# Patient Record
Sex: Female | Born: 1945 | Race: White | Hispanic: No | Marital: Married | Smoking: Former smoker
Health system: Southern US, Community
[De-identification: ages and names within clinical notes are randomized; demographics above are authoritative.]

## PROBLEM LIST (undated history)

## (undated) DIAGNOSIS — Z789 Other specified health status: Secondary | ICD-10-CM

## (undated) DIAGNOSIS — M1990 Unspecified osteoarthritis, unspecified site: Secondary | ICD-10-CM

## (undated) DIAGNOSIS — I4891 Unspecified atrial fibrillation: Secondary | ICD-10-CM

## (undated) HISTORY — PX: EYE SURGERY: SHX253

## (undated) HISTORY — DX: Unspecified osteoarthritis, unspecified site: M19.90

## (undated) HISTORY — PX: TONSILLECTOMY AND ADENOIDECTOMY: SUR1326

## (undated) HISTORY — PX: DILATION AND CURETTAGE OF UTERUS: SHX78

## (undated) HISTORY — PX: BREAST BIOPSY: SHX20

---

## 2006-06-08 ENCOUNTER — Ambulatory Visit: Payer: Self-pay

## 2013-08-17 ENCOUNTER — Ambulatory Visit: Payer: Self-pay | Admitting: Family Medicine

## 2014-08-07 LAB — TSH: TSH: 1.62 u[IU]/mL (ref 0.41–5.90)

## 2014-08-07 LAB — BASIC METABOLIC PANEL
BUN: 19 mg/dL (ref 4–21)
CREATININE: 0.8 mg/dL (ref 0.5–1.1)
Glucose: 93 mg/dL
Potassium: 4.4 mmol/L (ref 3.4–5.3)
SODIUM: 143 mmol/L (ref 137–147)

## 2014-08-07 LAB — HEPATIC FUNCTION PANEL
ALT: 18 U/L (ref 7–35)
AST: 22 U/L (ref 13–35)
Alkaline Phosphatase: 67 U/L (ref 25–125)
BILIRUBIN, TOTAL: 0.6 mg/dL

## 2014-08-07 LAB — LIPID PANEL
CHOLESTEROL: 257 mg/dL — AB (ref 0–200)
HDL: 87 mg/dL — AB (ref 35–70)
LDL Cholesterol: 152 mg/dL
Triglycerides: 91 mg/dL (ref 40–160)

## 2014-10-05 ENCOUNTER — Other Ambulatory Visit: Payer: Self-pay | Admitting: Otolaryngology

## 2014-10-05 DIAGNOSIS — J301 Allergic rhinitis due to pollen: Secondary | ICD-10-CM | POA: Diagnosis not present

## 2014-10-05 DIAGNOSIS — R221 Localized swelling, mass and lump, neck: Secondary | ICD-10-CM | POA: Diagnosis not present

## 2014-10-11 ENCOUNTER — Ambulatory Visit
Admission: RE | Admit: 2014-10-11 | Discharge: 2014-10-11 | Disposition: A | Discharge status: Home / Self Care | Payer: Medicare PPO | Source: Ambulatory Visit | Attending: Otolaryngology | Admitting: Otolaryngology

## 2014-10-11 DIAGNOSIS — R221 Localized swelling, mass and lump, neck: Secondary | ICD-10-CM | POA: Diagnosis not present

## 2014-10-11 MED ORDER — IOHEXOL 300 MG/ML  SOLN
75.0000 mL | Freq: Once | INTRAMUSCULAR | Status: AC | PRN
  Administered 2014-10-11: 75 mL via INTRAVENOUS

## 2015-03-27 DIAGNOSIS — H2513 Age-related nuclear cataract, bilateral: Secondary | ICD-10-CM | POA: Diagnosis not present

## 2015-06-13 DIAGNOSIS — M1990 Unspecified osteoarthritis, unspecified site: Secondary | ICD-10-CM | POA: Insufficient documentation

## 2015-06-13 DIAGNOSIS — E7800 Pure hypercholesterolemia, unspecified: Secondary | ICD-10-CM | POA: Insufficient documentation

## 2015-06-13 DIAGNOSIS — I8390 Asymptomatic varicose veins of unspecified lower extremity: Secondary | ICD-10-CM | POA: Insufficient documentation

## 2015-06-13 DIAGNOSIS — K589 Irritable bowel syndrome without diarrhea: Secondary | ICD-10-CM | POA: Insufficient documentation

## 2015-06-25 ENCOUNTER — Other Ambulatory Visit: Payer: Self-pay | Admitting: Family Medicine

## 2015-06-25 ENCOUNTER — Encounter: Payer: Self-pay | Admitting: Family Medicine

## 2015-06-25 ENCOUNTER — Ambulatory Visit (INDEPENDENT_AMBULATORY_CARE_PROVIDER_SITE_OTHER): Payer: Medicare Other | Admitting: Family Medicine

## 2015-06-25 VITALS — BP 128/74 | HR 84 | Temp 97.8°F | Resp 16 | Wt 193.0 lb

## 2015-06-25 DIAGNOSIS — K5900 Constipation, unspecified: Secondary | ICD-10-CM

## 2015-06-25 DIAGNOSIS — M79605 Pain in left leg: Secondary | ICD-10-CM | POA: Diagnosis not present

## 2015-06-25 DIAGNOSIS — M150 Primary generalized (osteo)arthritis: Principal | ICD-10-CM

## 2015-06-25 DIAGNOSIS — M159 Polyosteoarthritis, unspecified: Secondary | ICD-10-CM

## 2015-06-25 MED ORDER — MELOXICAM 7.5 MG PO TABS: 7.5000 mg | ORAL_TABLET | Freq: Two times a day (BID) | ORAL | Status: DC

## 2015-06-25 NOTE — Progress Notes (Signed)
         Patient: Melissa Barry Female    DOB: 11/05/1945   70 y.o.   MRN: [id number] Visit Date: 06/25/2015  Today's Provider: Lorie PhenixNancy Madisyn Mawhinney, MD   Chief Complaint  Patient presents with  . Leg Pain   Subjective:    Leg Pain  The incident occurred more than 1 week ago (Has been going on for about a month). There was no injury mechanism. The pain is present in the left leg. The quality of the pain is described as aching. The pain has been worsening (Especially since Sunday. Worse with stairs and  sitting too long. ) since onset. The symptoms are aggravated by weight bearing and movement. She has tried NSAIDs and acetaminophen for the symptoms. The treatment provided no relief (Patient concerned about gout.  ).       Allergies  Allergen Reactions  . Doxycycline Other (See Comments)    Made her feel sick the whole time she was taking it.   Marland Kitchen. Penicillins Rash  . Sulfa Antibiotics Swelling and Rash    Patient states throat swelling    Previous Medications   No medications on file    Review of Systems  Constitutional: Negative.   Musculoskeletal: Positive for arthralgias. Negative for myalgias, back pain, joint swelling, gait problem, neck pain and neck stiffness.  Neurological: Negative for dizziness, light-headedness and headaches.    Social History  Substance Use Topics  . Smoking status: Former Smoker -- 2 years    Types: Cigarettes  . Smokeless tobacco: Never Used  . Alcohol Use: No   Objective:   BP 128/74 mmHg  Pulse 84  Temp(Src) 97.8 F (36.6 C) (Oral)  Resp 16  Wt 193 lb (87.544 kg)  Physical Exam  Constitutional: She is oriented to person, place, and time. She appears well-developed and well-nourished.  Musculoskeletal: She exhibits no edema or tenderness.  Fullness behind left knee. Multiple varicose veins noted.    Neurological: She is alert and oriented to person, place, and time.  Skin: Skin is warm and dry.      Assessment & Plan:     1. Pain  of left lower extremity New problem Suspect Baker's cyst. Start anti-inflammatory and follow up with ortho as scheduled.    2. Primary osteoarthritis involving multiple joints Start medication.   - meloxicam (MOBIC) 7.5 MG tablet; Take 1 tablet (7.5 mg total) by mouth 2 (two) times daily.  Dispense: 60 tablet; Refill: 0  3. Constipation, unspecified constipation type Increase fluid intake and OTC Stool softeners.       Lorie PhenixNancy Shaily Librizzi, MD  [facility]

## 2015-08-07 ENCOUNTER — Encounter: Payer: Self-pay | Admitting: Family Medicine

## 2015-09-24 ENCOUNTER — Ambulatory Visit (INDEPENDENT_AMBULATORY_CARE_PROVIDER_SITE_OTHER): Payer: Medicare Other | Admitting: Family Medicine

## 2015-09-24 ENCOUNTER — Encounter: Payer: Self-pay | Admitting: Family Medicine

## 2015-09-24 VITALS — BP 126/86 | HR 72 | Temp 97.8°F | Resp 20 | Ht 64.75 in | Wt 193.0 lb

## 2015-09-24 DIAGNOSIS — Z1159 Encounter for screening for other viral diseases: Secondary | ICD-10-CM | POA: Diagnosis not present

## 2015-09-24 DIAGNOSIS — Z1211 Encounter for screening for malignant neoplasm of colon: Secondary | ICD-10-CM | POA: Diagnosis not present

## 2015-09-24 DIAGNOSIS — E7800 Pure hypercholesterolemia, unspecified: Secondary | ICD-10-CM

## 2015-09-24 DIAGNOSIS — Z0000 Encounter for general adult medical examination without abnormal findings: Secondary | ICD-10-CM | POA: Diagnosis not present

## 2015-09-24 DIAGNOSIS — Z780 Asymptomatic menopausal state: Secondary | ICD-10-CM | POA: Diagnosis not present

## 2015-09-24 DIAGNOSIS — Z23 Encounter for immunization: Secondary | ICD-10-CM | POA: Diagnosis not present

## 2015-09-24 NOTE — Progress Notes (Signed)
Patient ID: Melissa Barry, female   DOB: 1945/06/07, 70 y.o.   MRN: [id number]       Patient: Melissa Barry, Female    DOB: Apr 11, 1946, 70 y.o.   MRN: [id number] Visit Date: 09/24/2015  Today's Provider: Lorie Phenix, MD   Chief Complaint  Patient presents with  . Medicare Wellness   Subjective:    Annual wellness visit Melissa Barry is a 70 y.o. female. She feels well. She reports exercising none. She reports she is sleeping well.  08/06/14 AWE 09/14/13 Pap-neg, HPV-neg; GYN Dr. Luella Cook;  09/14/13 Mammogram-BI-RADS 1 GYN 08/17/13 BMD-osteopenia, recheck in 2 yrs -----------------------------------------------------------   Review of Systems  Constitutional: Positive for fatigue and unexpected weight change.  HENT: Negative.   Eyes: Negative.   Respiratory: Negative.   Cardiovascular: Negative.   Gastrointestinal: Negative.   Endocrine: Negative.   Genitourinary: Negative.   Musculoskeletal: Positive for joint swelling and arthralgias.  Skin: Negative.   Allergic/Immunologic: Negative.   Neurological: Negative.   Hematological: Negative.   Psychiatric/Behavioral: Negative.     Social History   Social History  . Marital Status: Married    Spouse Name: N/A  . Number of Children: 2  . Years of Education: N/A   Occupational History  . Not on file.   Social History Main Topics  . Smoking status: Former Smoker -- 2 years    Types: Cigarettes  . Smokeless tobacco: Never Used  . Alcohol Use: 1.8 oz/week    3 Cans of beer per week  . Drug Use: No  . Sexual Activity: Not on file   Other Topics Concern  . Not on file   Social History Narrative    History reviewed. No pertinent past medical history.   Patient Active Problem List   Diagnosis Date Noted  . Constipation 06/25/2015  . Hypercholesterolemia 06/13/2015  . 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 Arthritis, degenerative 06/13/2015  . 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 Tonsillectomy and adenoidectomy    . Breast biopsy    . Dilation and curettage of uterus      Her family history includes Celiac disease in her sister; Congestive Heart Failure in her father; Heart disease in her father; Hepatitis in her sister; Lung cancer in her father and paternal grandfather; Migraines in her daughter and son; Ovarian cancer in her mother; Parkinson's disease in her maternal grandfather; Sarcoidosis in her son; Stroke in her paternal grandmother; Thyroid disease in her father.    Previous Medications   No medications on file    Patient Care Team: Lorie Phenix, MD as PCP - General (Family Medicine)     Objective:   Vitals: BP 126/86 mmHg  Pulse 72  Temp(Src) 97.8 F (36.6 C) (Oral)  Resp 20  Ht 5' 4.75" (1.645 m)  Wt 193 lb (87.544 kg)  BMI 32.35 kg/m2  Physical Exam  Constitutional: She is oriented to person, place, and time. She appears well-developed and well-nourished.  HENT:  Head: Normocephalic and atraumatic.  Right Ear: Tympanic membrane, external ear and ear canal normal.  Left Ear: Tympanic membrane, external ear and ear canal normal.  Nose: Nose normal.  Mouth/Throat: Uvula is midline, oropharynx is clear and moist and mucous membranes are normal.  Eyes: Conjunctivae, EOM and lids are normal. Pupils are equal, round, and reactive to light.  Neck: Trachea normal and normal range of motion. Neck supple. Carotid bruit is not present. No thyroid mass and  no thyromegaly present.  Cardiovascular: Normal rate, regular rhythm and normal heart sounds.   Pulmonary/Chest: Effort normal and breath sounds normal.  Abdominal: Soft. Normal appearance and bowel sounds are normal. There is no hepatosplenomegaly. There is no tenderness.  Genitourinary: No breast swelling, tenderness or discharge.  Musculoskeletal: Normal range of motion.  Lymphadenopathy:    She has no cervical adenopathy.    She has no axillary adenopathy.  Neurological:  She is alert and oriented to person, place, and time. She has normal strength. No cranial nerve deficit.  Skin: Skin is warm, dry and intact.  Psychiatric: She has a normal mood and affect. Her speech is normal and behavior is normal. Judgment and thought content normal. Cognition and memory are normal.    Activities of Daily Living In your present state of health, do you have any difficulty performing the following activities: 09/24/2015  Hearing? N  Vision? N  Difficulty concentrating or making decisions? N  Walking or climbing stairs? Y  Dressing or bathing? N  Doing errands, shopping? N    Fall Risk Assessment Fall Risk  09/24/2015  Falls in the past year? No     Depression Screen PHQ 2/9 Scores 09/24/2015  PHQ - 2 Score 0    Cognitive Testing - 6-CIT  Correct? Score   What year is it? yes 0 0 or 4  What month is it? yes 0 0 or 3  Memorize:    Melissa Barry,  Melissa Barry,  Melissa Barry,  Melissa Barry,  Melissa Barry,      What time is it? (within 1 hour) yes 0 0 or 3  Count backwards from 20 yes 0 0, 2, or 4  Name the months of the year yes 0 0, 2, or 4  Repeat name & address above no 1 0, 2, 4, 6, 8, or 10       TOTAL SCORE  1/28   Interpretation:  Normal  Normal (0-7) Abnormal (8-28)     Assessment & Plan:     Annual Wellness Visit  Reviewed patient's Family Medical History Reviewed and updated list of patient's medical providers Assessment of cognitive impairment was done Assessed patient's functional ability Established a written schedule for health screening services Health Risk Assessent Completed and Reviewed  Exercise Activities and Dietary recommendations Goals    None      Immunization History  Administered Date(s) Administered  . Pneumococcal Conjugate-13 09/24/2015  . Pneumococcal Polysaccharide-23 02/03/2013     1. Medicare annual wellness visit, subsequent Stable. Patient advised to continue eating healthy and exercise daily.  2. Need for pneumococcal vaccination -  Pneumococcal conjugate vaccine 13-valent IM  3. Hypercholesterolemia - CBC with Differential/Platelet - Comprehensive metabolic panel - Lipid Panel With LDL/HDL Ratio - TSH  4. Postmenopausal - DG Bone Density; Future  5. Colon cancer screening - Ambulatory referral to Gastroenterology  6. Need for hepatitis C screening test - Hepatitis C antibody    Patient seen and examined by Dr. Leo GrosserNancy J.. Genelda Roark, and note scribed by Liz BeachSulibeya S. Dimas, CMA.  I have reviewed the document for accuracy and completeness and I agree with above. Leo Grosser- Shirl Weir J. Loray Akard, MD   Lorie PhenixNancy Oneal Schoenberger, MD   ------------------------------------------------------------------------------------------------------------

## 2015-09-26 ENCOUNTER — Telehealth: Payer: Self-pay

## 2015-09-26 LAB — CBC WITH DIFFERENTIAL/PLATELET
BASOS: 0 %
Basophils Absolute: 0 10*3/uL (ref 0.0–0.2)
EOS (ABSOLUTE): 0.1 10*3/uL (ref 0.0–0.4)
EOS: 1 %
HEMATOCRIT: 43.6 % (ref 34.0–46.6)
HEMOGLOBIN: 15.1 g/dL (ref 11.1–15.9)
IMMATURE GRANS (ABS): 0 10*3/uL (ref 0.0–0.1)
IMMATURE GRANULOCYTES: 0 %
LYMPHS: 6 %
Lymphocytes Absolute: 0.5 10*3/uL — ABNORMAL LOW (ref 0.7–3.1)
MCH: 30.6 pg (ref 26.6–33.0)
MCHC: 34.6 g/dL (ref 31.5–35.7)
MCV: 88 fL (ref 79–97)
MONOCYTES: 5 %
Monocytes Absolute: 0.5 10*3/uL (ref 0.1–0.9)
NEUTROS PCT: 88 %
Neutrophils Absolute: 8.2 10*3/uL — ABNORMAL HIGH (ref 1.4–7.0)
Platelets: 230 10*3/uL (ref 150–379)
RBC: 4.93 x10E6/uL (ref 3.77–5.28)
RDW: 13.2 % (ref 12.3–15.4)
WBC: 9.3 10*3/uL (ref 3.4–10.8)

## 2015-09-26 LAB — COMPREHENSIVE METABOLIC PANEL
ALK PHOS: 72 IU/L (ref 39–117)
ALT: 16 IU/L (ref 0–32)
AST: 20 IU/L (ref 0–40)
Albumin/Globulin Ratio: 2.1 (ref 1.2–2.2)
Albumin: 4.6 g/dL (ref 3.6–4.8)
BILIRUBIN TOTAL: 1 mg/dL (ref 0.0–1.2)
BUN/Creatinine Ratio: 19 (ref 12–28)
BUN: 16 mg/dL (ref 8–27)
CHLORIDE: 99 mmol/L (ref 96–106)
CO2: 25 mmol/L (ref 18–29)
Calcium: 9.5 mg/dL (ref 8.7–10.3)
Creatinine, Ser: 0.84 mg/dL (ref 0.57–1.00)
GFR calc Af Amer: 83 mL/min/{1.73_m2} (ref >59)
GFR calc non Af Amer: 72 mL/min/{1.73_m2} (ref >59)
Globulin, Total: 2.2 g/dL (ref 1.5–4.5)
Glucose: 110 mg/dL — ABNORMAL HIGH (ref 65–99)
Potassium: 4.6 mmol/L (ref 3.5–5.2)
Sodium: 139 mmol/L (ref 134–144)
Total Protein: 6.8 g/dL (ref 6.0–8.5)

## 2015-09-26 LAB — LIPID PANEL WITH LDL/HDL RATIO
CHOLESTEROL TOTAL: 256 mg/dL — AB (ref 100–199)
HDL: 83 mg/dL (ref >39)
LDL Calculated: 158 mg/dL — ABNORMAL HIGH (ref 0–99)
LDl/HDL Ratio: 1.9 ratio units (ref 0.0–3.2)
Triglycerides: 75 mg/dL (ref 0–149)
VLDL CHOLESTEROL CAL: 15 mg/dL (ref 5–40)

## 2015-09-26 LAB — HEPATITIS C ANTIBODY: Hep C Virus Ab: <0.1 s/co ratio (ref 0.0–0.9)

## 2015-09-26 LAB — TSH: TSH: 0.74 u[IU]/mL (ref 0.450–4.500)

## 2015-09-26 NOTE — Telephone Encounter (Signed)
-----   Message from Lorie PhenixNancy Maloney, MD sent at 09/26/2015  1:23 PM EDT ----- Labs stable except for mildly elevated blood sugar at 110, please add HgA1c, also cholesterol is elevated, but with high good cholesterol and 10 year risk of heart disease is 7.3 percent.  Right below cut off for recommending medication.   Would recommend eat healthy and exercise and recheck annually.  Thanks.

## 2015-09-26 NOTE — Telephone Encounter (Signed)
LMTCB 09/26/2015  Thanks,   -Vernona RiegerLaura

## 2015-09-27 NOTE — Telephone Encounter (Signed)
Pt advised; test added.   Thanks,   -Vernona RiegerLaura

## 2015-09-30 ENCOUNTER — Telehealth: Payer: Self-pay

## 2015-09-30 NOTE — Telephone Encounter (Signed)
-----   Message from Lorie PhenixNancy Maloney, MD sent at 09/29/2015  9:24 AM EDT ----- Blood sugar stable.  Please notify patient. Thanks.

## 2015-09-30 NOTE — Telephone Encounter (Signed)
Patient advised as below.  

## 2015-10-01 LAB — SPECIMEN STATUS REPORT

## 2015-10-01 LAB — HGB A1C W/O EAG: Hgb A1c MFr Bld: 5.1 % (ref 4.8–5.6)

## 2015-10-11 ENCOUNTER — Telehealth: Payer: Self-pay | Admitting: Gastroenterology

## 2015-10-11 NOTE — Telephone Encounter (Signed)
Patient is returning your call to schedule her colonoscopy.  

## 2015-10-18 ENCOUNTER — Other Ambulatory Visit: Payer: Self-pay

## 2015-10-18 NOTE — Telephone Encounter (Signed)
Gastroenterology Pre-Procedure Review  Request Date: 12/17/15 Requesting Physician: Dr. Elease HashimotoMaloney  PATIENT REVIEW QUESTIONS: The patient responded to the following health history questions as indicated:    1. Are you having any GI issues? no 2. Do you have a personal history of Polyps? no 3. Do you have a family history of Colon Cancer or Polyps? no 4. Diabetes Mellitus? no 5. Joint replacements in the past 12 months?no 6. Major health problems in the past 3 months?no 7. Any artificial heart valves, MVP, or defibrillator?no    MEDICATIONS & ALLERGIES:    Patient reports the following regarding taking any anticoagulation/antiplatelet therapy:   Plavix, Coumadin, Eliquis, Xarelto, Lovenox, Pradaxa, Brilinta, or Effient? no Aspirin? no  Patient confirms/reports the following medications:  No current outpatient prescriptions on file.   No current facility-administered medications for this visit.    Patient confirms/reports the following allergies:  Allergies  Allergen Reactions  . Doxycycline Other (See Comments)    Made her feel sick the whole time she was taking it.   Marland Kitchen. Penicillins Rash  . Sulfa Antibiotics Swelling and Rash    Patient states throat swelling     No orders of the defined types were placed in this encounter.    AUTHORIZATION INFORMATION Primary Insurance: 1D#: Group #:  Secondary Insurance: 1D#: Group #:  SCHEDULE INFORMATION: Date: 12/17/15 Time: Location: [facility]

## 2015-10-21 ENCOUNTER — Telehealth: Payer: Self-pay | Admitting: Gastroenterology

## 2015-10-21 NOTE — Telephone Encounter (Signed)
[facility]. Please precert.

## 2015-10-21 NOTE — Telephone Encounter (Signed)
Error

## 2015-10-23 ENCOUNTER — Ambulatory Visit
Admission: RE | Admit: 2015-10-23 | Discharge: 2015-10-23 | Disposition: A | Discharge status: Home / Self Care | Payer: Medicare Other | Source: Ambulatory Visit | Attending: Family Medicine | Admitting: Family Medicine

## 2015-10-23 DIAGNOSIS — Z780 Asymptomatic menopausal state: Secondary | ICD-10-CM

## 2015-10-23 DIAGNOSIS — Z13820 Encounter for screening for osteoporosis: Secondary | ICD-10-CM | POA: Diagnosis not present

## 2015-10-28 ENCOUNTER — Telehealth: Payer: Self-pay

## 2015-10-28 NOTE — Telephone Encounter (Signed)
LMTCB 10/28/2015  Thanks,   -Brittania Sudbeck  

## 2015-10-28 NOTE — Telephone Encounter (Signed)
-----   Message from Malva Limesonald E Fisher, MD sent at 10/25/2015  5:41 PM EDT ----- Bone density test is normal. Repeat every 3-5 years.

## 2015-11-01 NOTE — Telephone Encounter (Signed)
Pt advised.   Thanks,   -Saurabh Hettich  

## 2015-12-16 ENCOUNTER — Encounter: Payer: Self-pay | Admitting: *Deleted

## 2015-12-17 ENCOUNTER — Ambulatory Visit: Payer: Medicare Other | Admitting: Certified Registered"

## 2015-12-17 ENCOUNTER — Ambulatory Visit
Admission: RE | Admit: 2015-12-17 | Discharge: 2015-12-17 | Disposition: A | Discharge status: Home / Self Care | Payer: Medicare Other | Source: Ambulatory Visit | Attending: Gastroenterology | Admitting: Gastroenterology

## 2015-12-17 ENCOUNTER — Encounter: Payer: Self-pay | Admitting: *Deleted

## 2015-12-17 ENCOUNTER — Encounter
Admission: RE | Disposition: A | Discharge status: Home / Self Care | Payer: Self-pay | Source: Ambulatory Visit | Attending: Gastroenterology

## 2015-12-17 DIAGNOSIS — Z87891 Personal history of nicotine dependence: Secondary | ICD-10-CM | POA: Insufficient documentation

## 2015-12-17 DIAGNOSIS — K648 Other hemorrhoids: Secondary | ICD-10-CM | POA: Insufficient documentation

## 2015-12-17 DIAGNOSIS — K5730 Diverticulosis of large intestine without perforation or abscess without bleeding: Secondary | ICD-10-CM | POA: Diagnosis not present

## 2015-12-17 DIAGNOSIS — Z1211 Encounter for screening for malignant neoplasm of colon: Secondary | ICD-10-CM | POA: Diagnosis present

## 2015-12-17 DIAGNOSIS — I739 Peripheral vascular disease, unspecified: Secondary | ICD-10-CM | POA: Insufficient documentation

## 2015-12-17 HISTORY — DX: Other specified health status: Z78.9

## 2015-12-17 HISTORY — PX: COLONOSCOPY WITH PROPOFOL: SHX5780

## 2015-12-17 SURGERY — COLONOSCOPY WITH PROPOFOL
Anesthesia: General

## 2015-12-17 MED ORDER — PROPOFOL 500 MG/50ML IV EMUL
INTRAVENOUS | Status: DC | PRN
  Administered 2015-12-17: 125 ug/kg/min via INTRAVENOUS

## 2015-12-17 MED ORDER — PROPOFOL 10 MG/ML IV BOLUS
INTRAVENOUS | Status: DC | PRN
  Administered 2015-12-17: 60 mg via INTRAVENOUS

## 2015-12-17 MED ORDER — SODIUM CHLORIDE 0.9 % IV SOLN
INTRAVENOUS | Status: DC
  Administered 2015-12-17: 08:00:00 via INTRAVENOUS

## 2015-12-17 MED ORDER — LIDOCAINE HCL (CARDIAC) 20 MG/ML IV SOLN
INTRAVENOUS | Status: DC | PRN
  Administered 2015-12-17: 80 mg via INTRATRACHEAL

## 2015-12-17 NOTE — Transfer of Care (Signed)
Immediate Anesthesia Transfer of Care Note  Patient: Melissa SailorsJanie H [facility]  Procedure(s) Performed: Procedure(s): COLONOSCOPY WITH PROPOFOL (N/A)  Patient Location: PACU  Anesthesia Type:General  Level of Consciousness: sedated  Airway & Oxygen Therapy: Patient Spontanous Breathing and Patient connected to nasal cannula oxygen  Post-op Assessment: Report given to RN and Post -op Vital signs reviewed and stable  Post vital signs: Reviewed and stable  Last Vitals:  Vitals:   12/17/15 0747 12/17/15 0857  BP: (!) 150/59 (!) 117/51  Pulse: 65 67  Resp: 18 12  Temp: 36.3 C     Last Pain:  Vitals:   12/17/15 0747  TempSrc: Tympanic         Complications: No apparent anesthesia complications

## 2015-12-17 NOTE — H&P (Signed)
  Midge Miniumarren Rylinn Linzy, MD [facility] [address] [place], [place] [address] Phone: [phone] Fax : [phone]  Primary Care Physician:  Lorie PhenixNancy Maloney, MD Primary Gastroenterologist:  Dr. Servando SnareWohl  Pre-Procedure History & Physical: HPI:  Melissa HatcherJanie H Barry is a 70 y.o. female is here for a screening colonoscopy.   Past Medical History:  Diagnosis Date  . Medical history non-contributory     Past Surgical History:  Procedure Laterality Date  . BREAST BIOPSY    . DILATION AND CURETTAGE OF UTERUS    . TONSILLECTOMY AND ADENOIDECTOMY      Prior to Admission medications   Not on File    Allergies as of 10/18/2015 - Review Complete 09/24/2015  Allergen Reaction Noted  . Doxycycline Other (See Comments) 10/11/2014  . Penicillins Rash 10/11/2014  . Sulfa antibiotics Swelling and Rash 10/11/2014    Family History  Problem Relation Age of Onset  . Ovarian cancer Mother   . Heart disease Father   . Lung cancer Father   . Thyroid disease Father   . Congestive Heart Failure Father   . Hepatitis Sister   . Celiac disease Sister   . Migraines Daughter   . Migraines Son   . Sarcoidosis Son   . Parkinson's disease Maternal Grandfather   . Stroke Paternal Grandmother   . Lung cancer Paternal Grandfather     Social History   Social History  . Marital status: Married    Spouse name: N/A  . Number of children: 2  . Years of education: N/A   Occupational History  . Not on file.   Social History Main Topics  . Smoking status: Former Smoker    Years: 2.00    Types: Cigarettes  . Smokeless tobacco: Never Used  . Alcohol use 1.8 oz/week    3 Cans of beer per week  . Drug use: No  . Sexual activity: Not on file   Other Topics Concern  . Not on file   Social History Narrative  . No narrative on file    Review of Systems: See HPI, otherwise negative ROS  Physical Exam: BP (!) 150/59   Pulse 65   Temp 97.4 F (36.3 C) (Tympanic)   Resp 18   Ht 5\' 5"  (1.651 m)   Wt  193 lb (87.5 kg)   SpO2 100%   BMI 32.12 kg/m  General:   Alert,  pleasant and cooperative in NAD Head:  Normocephalic and atraumatic. Neck:  Supple; no masses or thyromegaly. Lungs:  Clear throughout to auscultation.    Heart:  Regular rate and rhythm. Abdomen:  Soft, nontender and nondistended. Normal bowel sounds, without guarding, and without rebound.   Neurologic:  Alert and  oriented x4;  grossly normal neurologically.  Impression/Plan: Melissa Barry is now here to undergo a screening colonoscopy.  Risks, benefits, and alternatives regarding colonoscopy have been reviewed with the patient.  Questions have been answered.  All parties agreeable.

## 2015-12-17 NOTE — Op Note (Signed)
[facility]enter[facility] Patient Name: Melissa AdjutantJanie Barry Procedure Date: 12/17/2015 8:38 AM MRN: [id number] Account #: [id number] Date of Birth: 06/29/1945 Admit Type: Outpatient Age: 70 [facility]OM 4 Gender: Female Note Status: Finalized Procedure:            Colonoscopy Indications:          Screening for colorectal malignant neoplasm Providers:            Midge Miniumarren Cristopher Ciccarelli MD, MD Referring MD:         Leo GrosserNancy J. Maloney, MD (Referring MD) Medicines:            Propofol per Anesthesia Complications:        No immediate complications. Procedure:            Pre-Anesthesia Assessment:                       - Prior to the procedure, a History and Physical was                        performed, and patient medications and allergies were                        reviewed. The patient's tolerance of previous                        anesthesia was also reviewed. The risks and benefits of                        the procedure and the sedation options and risks were                        discussed with the patient. All questions were                        answered, and informed consent was obtained. Prior                        Anticoagulants: The patient has taken no previous                        anticoagulant or antiplatelet agents. ASA Grade                        Assessment: II - A patient with mild systemic disease.                        After reviewing the risks and benefits, the patient was                        deemed in satisfactory condition to undergo the                        procedure.                       After obtaining informed consent, the colonoscope was                        passed under direct vision. Throughout the procedure,  the patient's blood pressure, pulse, and oxygen                        saturations were monitored continuously. The Olympus                        CF-Q160AL colonoscope (S#. [id number]) was introduced                      through the anus and advanced to the the cecum,                        identified by appendiceal orifice and ileocecal valve.                        The colonoscopy was performed without difficulty. The                        patient tolerated the procedure well. The quality of                        the bowel preparation was excellent. Findings:      The perianal and digital rectal examinations were normal.      A single small-mouthed diverticulum was found in the sigmoid colon.      Non-bleeding internal hemorrhoids were found during retroflexion. The       hemorrhoids were Grade I (internal hemorrhoids that do not prolapse). Impression:           - Diverticulosis in the sigmoid colon.                       - Non-bleeding internal hemorrhoids.                       - No specimens collected. Recommendation:       - Repeat colonoscopy in 10 years for screening unless                        any change in family history or lower GI problems. Procedure Code(s):    --- Professional ---                       [phone], Colonoscopy, flexible; diagnostic, including                        collection of specimen(s) by brushing or washing, when                        performed (separate procedure) Diagnosis Code(s):    --- Professional ---                       Z12.11, Encounter for screening for malignant neoplasm                        of colon CPT copyright 2016 American Medical Association. All rights reserved. The codes documented in this report are preliminary and upon coder review may  be revised to meet current compliance requirements. Midge Minium MD, MD 12/17/2015 8:55:16 AM This report has been signed electronically. Number of Addenda: 0 Note Initiated On: 12/17/2015 8:38 AM Scope Withdrawal Time: 0  hours 6 minutes 38 seconds  Total Procedure Duration: 0 hours 10 minutes 58 seconds       [facility]

## 2015-12-17 NOTE — Anesthesia Preprocedure Evaluation (Signed)
Anesthesia Evaluation  Patient identified by MRN, date of birth, ID band Patient awake    Reviewed: Allergy & Precautions, H&P , NPO status , Patient's Chart, lab work & pertinent test results, reviewed documented beta blocker date and time   Airway Mallampati: II   Neck ROM: full    Dental  (+) Poor Dentition   Pulmonary neg pulmonary ROS, former smoker,    Pulmonary exam normal        Cardiovascular + Peripheral Vascular Disease  negative cardio ROS Normal cardiovascular exam     Neuro/Psych negative neurological ROS  negative psych ROS   GI/Hepatic negative GI ROS, Neg liver ROS,   Endo/Other  negative endocrine ROS  Renal/GU negative Renal ROS  negative genitourinary   Musculoskeletal   Abdominal   Peds  Hematology negative hematology ROS (+)   Anesthesia Other Findings Past Medical History: No date: Medical history non-contributory Past Surgical History: No date: BREAST BIOPSY No date: DILATION AND CURETTAGE OF UTERUS No date: TONSILLECTOMY AND ADENOIDECTOMY BMI    Body Mass Index:  32.12 kg/m     Reproductive/Obstetrics                             Anesthesia Physical Anesthesia Plan  ASA: III  Anesthesia Plan: General   Post-op Pain Management:    Induction:   Airway Management Planned:   Additional Equipment:   Intra-op Plan:   Post-operative Plan:   Informed Consent: I have reviewed the patients History and Physical, chart, labs and discussed the procedure including the risks, benefits and alternatives for the proposed anesthesia with the patient or authorized representative who has indicated his/her understanding and acceptance.   Dental Advisory Given  Plan Discussed with: CRNA  Anesthesia Plan Comments:         Anesthesia Quick Evaluation

## 2015-12-17 NOTE — Anesthesia Postprocedure Evaluation (Signed)
Anesthesia Post Note  Patient: Melissa SailorsJanie H [facility]  Procedure(s) Performed: Procedure(s) (LRB): COLONOSCOPY WITH PROPOFOL (N/A)  Patient location during evaluation: PACU Anesthesia Type: General Level of consciousness: awake and alert Pain management: pain level controlled Vital Signs Assessment: post-procedure vital signs reviewed and stable Respiratory status: spontaneous breathing, nonlabored ventilation, respiratory function stable and patient connected to nasal cannula oxygen Cardiovascular status: blood pressure returned to baseline and stable Postop Assessment: no signs of nausea or vomiting Anesthetic complications: no    Last Vitals:  Vitals:   12/17/15 0857 12/17/15 0905  BP: (!) 117/51   Pulse: 69 63  Resp: 11 13  Temp: (!) 36.1 C     Last Pain:  Vitals:   12/17/15 0857  TempSrc: Tympanic                 Yevette EdwardsJames G Cali Cuartas

## 2015-12-18 ENCOUNTER — Encounter: Payer: Self-pay | Admitting: Gastroenterology

## 2017-02-23 ENCOUNTER — Ambulatory Visit (INDEPENDENT_AMBULATORY_CARE_PROVIDER_SITE_OTHER): Payer: Medicare Other

## 2017-02-23 ENCOUNTER — Ambulatory Visit (INDEPENDENT_AMBULATORY_CARE_PROVIDER_SITE_OTHER): Payer: Medicare Other | Admitting: Family Medicine

## 2017-02-23 VITALS — BP 148/80 | HR 64 | Temp 98.7°F | Ht 65.0 in | Wt 193.8 lb

## 2017-02-23 VITALS — BP 142/82 | HR 64 | Temp 98.7°F | Resp 16 | Ht 65.0 in | Wt 193.8 lb

## 2017-02-23 DIAGNOSIS — Z0000 Encounter for general adult medical examination without abnormal findings: Secondary | ICD-10-CM | POA: Diagnosis not present

## 2017-02-23 DIAGNOSIS — E7800 Pure hypercholesterolemia, unspecified: Secondary | ICD-10-CM | POA: Diagnosis not present

## 2017-02-23 DIAGNOSIS — R030 Elevated blood-pressure reading, without diagnosis of hypertension: Secondary | ICD-10-CM | POA: Diagnosis not present

## 2017-02-23 NOTE — Patient Instructions (Signed)

## 2017-02-23 NOTE — Progress Notes (Signed)
Subjective:   Melissa Barry is a 71 y.o. female who presents for Medicare Annual (Subsequent) preventive examination.  Review of Systems:  N/A  Cardiac Risk Factors include: advanced age (>2855men, 69>65 women);obesity (BMI >30kg/m2);dyslipidemia     Objective:     Vitals: BP (!) 148/80 (BP Location: Left Arm)   Pulse 64   Temp 98.7 F (37.1 C) (Oral)   Ht 5\' 5"  (1.651 m)   Wt 193 lb 12.8 oz (87.9 kg)   BMI 32.25 kg/m   Body mass index is 32.25 kg/m.   Tobacco Social History   Tobacco Use  Smoking Status Former Smoker  . Years: 2.00  . Types: Cigarettes  Smokeless Tobacco Never Used     Counseling given: Not Answered   Past Medical History:  Diagnosis Date  . Medical history non-contributory    Past Surgical History:  Procedure Laterality Date  . BREAST BIOPSY    . DILATION AND CURETTAGE OF UTERUS    . TONSILLECTOMY AND ADENOIDECTOMY     Family History  Problem Relation Age of Onset  . Ovarian cancer Mother   . Heart disease Father   . Lung cancer Father   . Thyroid disease Father   . Congestive Heart Failure Father   . Hepatitis Sister   . Celiac disease Sister   . Migraines Daughter   . Migraines Son   . Sarcoidosis Son   . Parkinson's disease Maternal Grandfather   . Stroke Paternal Grandmother   . Lung cancer Paternal Grandfather    Social History   Substance and Sexual Activity  Sexual Activity Not on file    Outpatient Encounter Medications as of 02/23/2017  Medication Sig  . ibuprofen (ADVIL,MOTRIN) 200 MG tablet Take 200 mg every 6 (six) hours as needed by mouth.   No facility-administered encounter medications on file as of 02/23/2017.     Activities of Daily Living In your present state of health, do you have any difficulty performing the following activities: 02/23/2017  Hearing? N  Vision? N  Difficulty concentrating or making decisions? N  Walking or climbing stairs? Y  Comment right knee pain and feels like its going to  "give out"  Dressing or bathing? N  Doing errands, shopping? N  Preparing Food and eating ? N  Using the Toilet? N  In the past six months, have you accidently leaked urine? N  Do you have problems with loss of bowel control? N  Managing your Medications? N  Managing your Finances? N  Housekeeping or managing your Housekeeping? N  Some recent data might be hidden    Patient Care Team: Erasmo DownerBacigalupo, Angela M, MD as PCP - General (Family Medicine) Juanell FairlyKrasinski, Kevin, MD as Referring Physician (Orthopedic Surgery) Galen ManilaPorfilio, William, MD as Referring Physician (Ophthalmology)    Assessment:     Exercise Activities and Dietary recommendations Current Exercise Habits: The patient does not participate in regular exercise at present, Exercise limited by: None identified  Goals    . Exercise 3x per week (30 min per time)     Recommend doing some form of exercise for 3 days a week for at least 30 minutes.       Fall Risk Fall Risk  02/23/2017 09/24/2015  Falls in the past year? No No   Depression Screen PHQ 2/9 Scores 02/23/2017 02/23/2017 09/24/2015  PHQ - 2 Score 0 0 0  PHQ- 9 Score 0 - -     Cognitive Function: Pt declined a screening today.  Immunization History  Administered Date(s) Administered  . Pneumococcal Conjugate-13 09/24/2015  . Pneumococcal Polysaccharide-23 02/03/2013  . Td 06/24/2010  . Tdap 06/24/2010   Screening Tests Health Maintenance  Topic Date Due  . MAMMOGRAM  09/15/2015  . INFLUENZA VACCINE  07/19/2017 (Originally 11/18/2016)  . TETANUS/TDAP  06/23/2020  . COLONOSCOPY  12/16/2025  . DEXA SCAN  Completed  . Hepatitis C Screening  Completed  . PNA vac Low Risk Adult  Completed      Plan:  I have personally reviewed and addressed the Medicare Annual Wellness questionnaire and have noted the following in the patient's chart:  A. Medical and social history B. Use of alcohol, tobacco or illicit drugs  C. Current medications and  supplements D. Functional ability and status E.  Nutritional status F.  Physical activity G. Advance directives H. List of other physicians I.  Hospitalizations, surgeries, and ER visits in previous 12 months J.  Vitals K. Screenings such as hearing and vision if needed, cognitive and depression L. Referrals and appointments - none  In addition, I have reviewed and discussed with patient certain preventive protocols, quality metrics, and best practice recommendations. A written personalized care plan for preventive services as well as general preventive health recommendations were provided to patient.  See attached scanned questionnaire for additional information.   Signed,  Hyacinth MeekerMckenzie Darious Rehman, LPN Nurse Health Advisor   MD Recommendations: Pt declined the influenza vaccine today. Pt to set up her mammogram by the end of 2018.

## 2017-02-23 NOTE — Assessment & Plan Note (Addendum)
Has not made any lifestyle changes at this time long discussion about healthy diet/exercise Recheck lipid panel Advised patient to consider baby ASA daily Reconsider statin pending ASCVD risk

## 2017-02-23 NOTE — Patient Instructions (Signed)
[name] , Thank you for taking time to come for your Medicare Wellness Visit. I appreciate your ongoing commitment to your health goals. Please review the following plan we discussed and let me know if I can assist you in the future.   Screening recommendations/referrals: Colonoscopy: Up to date Mammogram: Pt to set up by the end of 2018 Bone Density: Up to date Recommended yearly ophthalmology/optometry visit for glaucoma screening and checkup Recommended yearly dental visit for hygiene and checkup  Vaccinations: Influenza vaccine: declined Pneumococcal vaccine: completed series Tdap vaccine: up to date Shingles vaccine: declined  Advanced directives: Advance directive discussed with you today. Even though you declined this today please call our office should you change your mind and we can give you the proper paperwork for you to fill out.  Conditions/risks identified: Obesity- recommend doing some form of exercise for 3 days a week for at least 30 minutes.   Next appointment: 11:00 AM today   Preventive Care 65 Years and Older, Female Preventive care refers to lifestyle choices and visits with your health care provider that can promote health and wellness. What does preventive care include?  A yearly physical exam. This is also called an annual well check.  Dental exams once or twice a year.  Routine eye exams. Ask your health care provider how often you should have your eyes checked.  Personal lifestyle choices, including:  Daily care of your teeth and gums.  Regular physical activity.  Eating a healthy diet.  Avoiding tobacco and drug use.  Limiting alcohol use.  Practicing safe sex.  Taking low-dose aspirin every day.  Taking vitamin and mineral supplements as recommended by your health care provider. What happens during an annual well check? The services and screenings done by your health care provider during your annual well check will depend on your  age, overall health, lifestyle risk factors, and family history of disease. Counseling  Your health care provider may ask you questions about your:  Alcohol use.  Tobacco use.  Drug use.  Emotional well-being.  Home and relationship well-being.  Sexual activity.  Eating habits.  History of falls.  Memory and ability to understand (cognition).  Work and work Astronomerenvironment.  Reproductive health. Screening  You may have the following tests or measurements:  Height, weight, and BMI.  Blood pressure.  Lipid and cholesterol levels. These may be checked every 5 years, or more frequently if you are over 69 years old.  Skin check.  Lung cancer screening. You may have this screening every year starting at age 69 if you have a 30-pack-year history of smoking and currently smoke or have quit within the past 15 years.  Fecal occult blood test (FOBT) of the stool. You may have this test every year starting at age 69.  Flexible sigmoidoscopy or colonoscopy. You may have a sigmoidoscopy every 5 years or a colonoscopy every 10 years starting at age 69.  Hepatitis C blood test.  Hepatitis B blood test.  Sexually transmitted disease (STD) testing.  Diabetes screening. This is done by checking your blood sugar (glucose) after you have not eaten for a while (fasting). You may have this done every 1-3 years.  Bone density scan. This is done to screen for osteoporosis. You may have this done starting at age 69.  Mammogram. This may be done every 1-2 years. Talk to your health care provider about how often you should have regular mammograms. Talk with your health care provider about your test results,  treatment options, and if necessary, the need for more tests. Vaccines  Your health care provider may recommend certain vaccines, such as:  Influenza vaccine. This is recommended every year.  Tetanus, diphtheria, and acellular pertussis (Tdap, Td) vaccine. You may need a Td booster  every 10 years.  Zoster vaccine. You may need this after age 38.  Pneumococcal 13-valent conjugate (PCV13) vaccine. One dose is recommended after age 11.  Pneumococcal polysaccharide (PPSV23) vaccine. One dose is recommended after age 61. Talk to your health care provider about which screenings and vaccines you need and how often you need them. This information is not intended to replace advice given to you by your health care provider. Make sure you discuss any questions you have with your health care provider. Document Released: 05/03/2015 Document Revised: 12/25/2015 Document Reviewed: 02/05/2015 Elsevier Interactive Patient Education  2017 [place] Prevention in the Home Falls can cause injuries. They can happen to people of all ages. There are many things you can do to make your home safe and to help prevent falls. What can I do on the outside of my home?  Regularly fix the edges of walkways and driveways and fix any cracks.  Remove anything that might make you trip as you walk through a door, such as a raised step or threshold.  Trim any bushes or trees on the path to your home.  Use bright outdoor lighting.  Clear any walking paths of anything that might make someone trip, such as rocks or tools.  Regularly check to see if handrails are loose or broken. Make sure that both sides of any steps have handrails.  Any raised decks and porches should have guardrails on the edges.  Have any leaves, snow, or ice cleared regularly.  Use sand or salt on walking paths during winter.  Clean up any spills in your garage right away. This includes oil or grease spills. What can I do in the bathroom?  Use night lights.  Install grab bars by the toilet and in the tub and shower. Do not use towel bars as grab bars.  Use non-skid mats or decals in the tub or shower.  If you need to sit down in the shower, use a plastic, non-slip stool.  Keep the floor dry. Clean up any  water that spills on the floor as soon as it happens.  Remove soap buildup in the tub or shower regularly.  Attach bath mats securely with double-sided non-slip rug tape.  Do not have throw rugs and other things on the floor that can make you trip. What can I do in the bedroom?  Use night lights.  Make sure that you have a light by your bed that is easy to reach.  Do not use any sheets or blankets that are too big for your bed. They should not hang down onto the floor.  Have a firm chair that has side arms. You can use this for support while you get dressed.  Do not have throw rugs and other things on the floor that can make you trip. What can I do in the kitchen?  Clean up any spills right away.  Avoid walking on wet floors.  Keep items that you use a lot in easy-to-reach places.  If you need to reach something above you, use a strong step stool that has a grab bar.  Keep electrical cords out of the way.  Do not use floor polish or wax that makes floors  slippery. If you must use wax, use non-skid floor wax.  Do not have throw rugs and other things on the floor that can make you trip. What can I do with my stairs?  Do not leave any items on the stairs.  Make sure that there are handrails on both sides of the stairs and use them. Fix handrails that are broken or loose. Make sure that handrails are as long as the stairways.  Check any carpeting to make sure that it is firmly attached to the stairs. Fix any carpet that is loose or worn.  Avoid having throw rugs at the top or bottom of the stairs. If you do have throw rugs, attach them to the floor with carpet tape.  Make sure that you have a light switch at the top of the stairs and the bottom of the stairs. If you do not have them, ask someone to add them for you. What else can I do to help prevent falls?  Wear shoes that:  Do not have high heels.  Have rubber bottoms.  Are comfortable and fit you well.  Are closed  at the toe. Do not wear sandals.  If you use a stepladder:  Make sure that it is fully opened. Do not climb a closed stepladder.  Make sure that both sides of the stepladder are locked into place.  Ask someone to hold it for you, if possible.  Clearly mark and make sure that you can see:  Any grab bars or handrails.  First and last steps.  Where the edge of each step is.  Use tools that help you move around (mobility aids) if they are needed. These include:  Canes.  Walkers.  Scooters.  Crutches.  Turn on the lights when you go into a dark area. Replace any light bulbs as soon as they burn out.  Set up your furniture so you have a clear path. Avoid moving your furniture around.  If any of your floors are uneven, fix them.  If there are any pets around you, be aware of where they are.  Review your medicines with your doctor. Some medicines can make you feel dizzy. This can increase your chance of falling. Ask your doctor what other things that you can do to help prevent falls. This information is not intended to replace advice given to you by your health care provider. Make sure you discuss any questions you have with your health care provider. Document Released: 01/31/2009 Document Revised: 09/12/2015 Document Reviewed: 05/11/2014 Elsevier Interactive Patient Education  2017 [employer].

## 2017-02-23 NOTE — Progress Notes (Signed)
Patient: Melissa Barry, Female    DOB: 1945/06/27, 71 y.o.   MRN: [id number] Visit Date: 02/23/2017  Today's Provider: Shirlee Latch, MD   Chief Complaint  Patient presents with  . Hyperlipidemia   Subjective:    Annual physical exam Melissa Barry is a 71 y.o. female who presents today for health maintenance and complete physical. She feels well. She reports exercising none. She reports she is sleeping well.  -----------------------------------------------------------------  Lipid/Cholesterol, Follow-up:   Last seen for this more than 1 year ago.  Management changes since that visit include checking labs; 10 year risk of heart dx/CVA was 7.3%. She was advised to eat healthy and exercise. She states she eats healthy, "but eats too much". Pt also states she does not exercise.  She has not made changes to diet/exercise.  She does not want to take a medication for her cholesterol. . Last Lipid Panel:    Component Value Date/Time   CHOL 256 (H) 09/25/2015 0826   TRIG 75 09/25/2015 0826   HDL 83 09/25/2015 0826   LDLCALC 158 (H) 09/25/2015 0826    Risk factors for vascular disease include hypercholesterolemia  Weight trend: stable Current diet: well balanced Current exercise: none  Wt Readings from Last 3 Encounters:  02/23/17 193 lb 12.8 oz (87.9 kg)  02/23/17 193 lb 12.8 oz (87.9 kg)  12/17/15 193 lb (87.5 kg)    -------------------------------------------------------------------   Review of Systems  Constitutional: Negative.   HENT: Negative.   Eyes: Negative.   Respiratory: Negative.   Cardiovascular: Negative.   Gastrointestinal: Negative.   Endocrine: Negative.   Genitourinary: Negative.   Musculoskeletal: Negative.   Skin: Negative.   Allergic/Immunologic: Negative.   Neurological: Negative.   Hematological: Negative.   Psychiatric/Behavioral: Negative.     Social History      She  reports that she has quit smoking. Her smoking use  included cigarettes. She quit after 2.00 years of use. she has never used smokeless tobacco. She reports that she drinks about 0.6 - 1.2 oz of alcohol per week. She reports that she does not use drugs.       Social History   Socioeconomic History  . Marital status: Married    Spouse name: Not on file  . Number of children: 2  . Years of education: Not on file  . Highest education level: Not on file  Social Needs  . Financial resource strain: Not on file  . Food insecurity - worry: Not on file  . Food insecurity - inability: Not on file  . Transportation needs - medical: Not on file  . Transportation needs - non-medical: Not on file  Occupational History  . Not on file  Tobacco Use  . Smoking status: Former Smoker    Years: 2.00    Types: Cigarettes  . Smokeless tobacco: Never Used  Substance and Sexual Activity  . Alcohol use: Yes    Alcohol/week: 0.6 - 1.2 oz    Types: 1 - 2 Cans of beer per week  . Drug use: No  . Sexual activity: Not on file  Other Topics Concern  . Not on file  Social History Narrative  . Not on file    Past Medical History:  Diagnosis Date  . Medical history non-contributory      Patient Active Problem List   Diagnosis Date Noted  . Special screening for malignant neoplasms, colon   . Constipation 06/25/2015  . Hypercholesterolemia 06/13/2015  .  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 Arthritis, degenerative 06/13/2015  . 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 BREAST BIOPSY    . DILATION AND CURETTAGE OF UTERUS    . TONSILLECTOMY AND ADENOIDECTOMY      Family History        Family Status  Relation Name Status  . Mother  Deceased at age 69  . Father  Deceased at age 69  . Sister  Alive  . Daughter  Alive  . Son  Alive  . MGF  Deceased  . PGM  Deceased  . PGF  Deceased        Her family history includes Celiac disease in her sister; Congestive Heart Failure in her father; Heart disease in her  father; Hepatitis in her sister; Lung cancer in her father and paternal grandfather; Migraines in her daughter and son; Ovarian cancer in her mother; Parkinson's disease in her maternal grandfather; Sarcoidosis in her son; Stroke in her paternal grandmother; Thyroid disease in her father.     Allergies  Allergen Reactions  . Doxycycline Other (See Comments)    Made her feel sick the whole time she was taking it.   Marland Kitchen. Penicillins Rash  . Sulfa Antibiotics Swelling and Rash    Patient states throat swelling     No current outpatient medications on file.   Patient Care Team: Erasmo DownerBacigalupo, Makinzee Durley M, MD as PCP - General (Family Medicine) Juanell FairlyKrasinski, Kevin, MD as Referring Physician (Orthopedic Surgery) Galen ManilaPorfilio, William, MD as Referring Physician (Ophthalmology)      Objective:   Vitals: BP (!) 142/82 (BP Location: Left Arm, Patient Position: Sitting, Cuff Size: Large)   Pulse 64   Temp 98.7 F (37.1 C) (Oral)   Resp 16   Ht 5\' 5"  (1.651 m)   Wt 193 lb 12.8 oz (87.9 kg)   BMI 32.25 kg/m    Vitals:   02/23/17 1052  BP: (!) 142/82  Pulse: 64  Resp: 16  Temp: 98.7 F (37.1 C)  TempSrc: Oral  Weight: 193 lb 12.8 oz (87.9 kg)  Height: 5\' 5"  (1.651 m)     Physical Exam  Constitutional: She is oriented to person, place, and time. She appears well-developed and well-nourished. No distress.  HENT:  Head: Normocephalic and atraumatic.  Right Ear: External ear normal.  Left Ear: External ear normal.  Nose: Nose normal.  Mouth/Throat: Oropharynx is clear and moist. No oropharyngeal exudate.  Eyes: Conjunctivae are normal. Pupils are equal, round, and reactive to light. No scleral icterus.  Neck: Neck supple. No thyromegaly present.  Cardiovascular: Normal rate, regular rhythm, normal heart sounds and intact distal pulses.  No murmur heard. Pulmonary/Chest: Effort normal and breath sounds normal. No respiratory distress. She has no wheezes. She has no rales.  Abdominal: Soft. Bowel  sounds are normal. She exhibits no distension. There is no tenderness. There is no rebound and no guarding.  Musculoskeletal: She exhibits no edema or deformity.  Lymphadenopathy:    She has no cervical adenopathy.  Neurological: She is alert and oriented to person, place, and time.  Skin: Skin is warm and dry. No rash noted.  Psychiatric: She has a normal mood and affect. Her behavior is normal.  Vitals reviewed.    Depression Screen PHQ 2/9 Scores 02/23/2017 02/23/2017 09/24/2015  PHQ - 2 Score 0 0 0  PHQ- 9 Score 0 - -     Assessment & Plan:     Routine Health Maintenance  and Physical Exam  Exercise Activities and Dietary recommendations Goals    . Exercise 3x per week (30 min per time)     Recommend doing some form of exercise for 3 days a week for at least 30 minutes.        Immunization History  Administered Date(s) Administered  . Pneumococcal Conjugate-13 09/24/2015  . Pneumococcal Polysaccharide-23 02/03/2013  . Td 06/24/2010  . Tdap 06/24/2010    Health Maintenance  Topic Date Due  . MAMMOGRAM  09/15/2015  . INFLUENZA VACCINE  07/19/2017 (Originally 11/18/2016)  . TETANUS/TDAP  06/23/2020  . COLONOSCOPY  12/16/2025  . DEXA SCAN  Completed  . Hepatitis C Screening  Completed  . PNA vac Low Risk Adult  Completed     Discussed health benefits of physical activity, and encouraged her to engage in regular exercise appropriate for her age and condition.    Problem List Items Addressed This Visit      Other   Hypercholesterolemia    Has not made any lifestyle changes at this time long discussion about healthy diet/exercise Recheck lipid panel Advised patient to consider baby ASA daily Reconsider statin pending ASCVD risk      Relevant Orders   Comprehensive metabolic panel   Lipid panel   Elevated BP without diagnosis of hypertension    Elevated today Likely white coat as patient states this is normally well controlled Recheck at next visit Check  CMP       Other Visit Diagnoses    Healthcare maintenance    -  Primary   Relevant Orders   CBC   Comprehensive metabolic panel   Lipid panel      -------------------------------------------------------------------- Return in about 1 year (around 02/23/2018) for AWV/CPE.  The entirety of the information documented in the History of Present Illness, Review of Systems and Physical Exam were personally obtained by me. Portions of this information were initially documented by Irving BurtonEmily Ratchford, CMA and reviewed by me for thoroughness and accuracy.    Shirlee LatchAngela Liev Brockbank, MD  [facility]

## 2017-02-23 NOTE — Progress Notes (Deleted)
       Patient: Melissa HatcherJanie H Barry Female    DOB: 05/21/1945   71 y.o.   MRN: [id number] Visit Date: 02/23/2017  Today's Provider: Shirlee LatchAngela Bacigalupo, MD   Chief Complaint  Patient presents with  . Hyperlipidemia   Subjective:    HPI      Lipid/Cholesterol, Follow-up:   Last seen for this more than 1 year ago.  Management changes since that visit include checking labs; 10 year risk of heart dx/CVA was 7.3%. She was advised to eat healthy and exercise. She states she eats healthy, "but eats too much". Pt also states she does not exercise. . Last Lipid Panel:    Component Value Date/Time   CHOL 256 (H) 09/25/2015 0826   TRIG 75 09/25/2015 0826   HDL 83 09/25/2015 0826   LDLCALC 158 (H) 09/25/2015 0826    Risk factors for vascular disease include hypercholesterolemia  Weight trend: stable Current diet: well balanced Current exercise: none  Wt Readings from Last 3 Encounters:  02/23/17 193 lb 12.8 oz (87.9 kg)  02/23/17 193 lb 12.8 oz (87.9 kg)  12/17/15 193 lb (87.5 kg)    -------------------------------------------------------------------   Allergies  Allergen Reactions  . Doxycycline Other (See Comments)    Made her feel sick the whole time she was taking it.   Marland Kitchen. Penicillins Rash  . Sulfa Antibiotics Swelling and Rash    Patient states throat swelling      Current Outpatient Medications:  .  ibuprofen (ADVIL,MOTRIN) 200 MG tablet, Take 200 mg every 6 (six) hours as needed by mouth., Disp: , Rfl:   Review of Systems  Constitutional: Negative.   HENT: Negative.   Eyes: Negative.   Respiratory: Negative.   Cardiovascular: Negative.   Gastrointestinal: Negative.   Endocrine: Negative.   Genitourinary: Negative.   Musculoskeletal: Negative.   Skin: Negative.   Allergic/Immunologic: Negative.   Neurological: Negative.   Hematological: Negative.   Psychiatric/Behavioral: Negative.     Social History   Tobacco Use  . Smoking status: Former Smoker   Years: 2.00    Types: Cigarettes  . Smokeless tobacco: Never Used  Substance Use Topics  . Alcohol use: Yes    Alcohol/week: 0.6 - 1.2 oz    Types: 1 - 2 Cans of beer per week   Objective:   BP (!) 142/82 (BP Location: Left Arm, Patient Position: Sitting, Cuff Size: Large)   Pulse 64   Temp 98.7 F (37.1 C) (Oral)   Resp 16   Ht 5\' 5"  (1.651 m)   Wt 193 lb 12.8 oz (87.9 kg)   BMI 32.25 kg/m  Vitals:   02/23/17 1052  BP: (!) 142/82  Pulse: 64  Resp: 16  Temp: 98.7 F (37.1 C)  TempSrc: Oral  Weight: 193 lb 12.8 oz (87.9 kg)  Height: 5\' 5"  (1.651 m)     Physical Exam      Assessment & Plan:           Shirlee LatchAngela Bacigalupo, MD  [facility]

## 2017-02-24 ENCOUNTER — Other Ambulatory Visit: Payer: Self-pay | Admitting: Family Medicine

## 2017-02-24 DIAGNOSIS — R030 Elevated blood-pressure reading, without diagnosis of hypertension: Secondary | ICD-10-CM | POA: Insufficient documentation

## 2017-02-24 DIAGNOSIS — Z1231 Encounter for screening mammogram for malignant neoplasm of breast: Secondary | ICD-10-CM

## 2017-02-24 LAB — LIPID PANEL
CHOL/HDL RATIO: 3.2 (calc) (ref <5.0)
Cholesterol: 261 mg/dL — ABNORMAL HIGH (ref <200)
HDL: 82 mg/dL (ref >50)
LDL CHOLESTEROL (CALC): 156 mg/dL — AB
NON-HDL CHOLESTEROL (CALC): 179 mg/dL — AB (ref <130)
TRIGLYCERIDES: 117 mg/dL (ref <150)

## 2017-02-24 LAB — CBC
HEMATOCRIT: 43.6 % (ref 35.0–45.0)
Hemoglobin: 14.8 g/dL (ref 11.7–15.5)
MCH: 30 pg (ref 27.0–33.0)
MCHC: 33.9 g/dL (ref 32.0–36.0)
MCV: 88.4 fL (ref 80.0–100.0)
MPV: 11 fL (ref 7.5–12.5)
Platelets: 229 10*3/uL (ref 140–400)
RBC: 4.93 10*6/uL (ref 3.80–5.10)
RDW: 12.3 % (ref 11.0–15.0)
WBC: 5.2 10*3/uL (ref 3.8–10.8)

## 2017-02-24 LAB — COMPLETE METABOLIC PANEL WITH GFR
AG Ratio: 2 (calc) (ref 1.0–2.5)
ALKALINE PHOSPHATASE (APISO): 67 U/L (ref 33–130)
ALT: 15 U/L (ref 6–29)
AST: 19 U/L (ref 10–35)
Albumin: 4.4 g/dL (ref 3.6–5.1)
BILIRUBIN TOTAL: 0.8 mg/dL (ref 0.2–1.2)
BUN: 19 mg/dL (ref 7–25)
CHLORIDE: 105 mmol/L (ref 98–110)
CO2: 28 mmol/L (ref 20–32)
Calcium: 9.5 mg/dL (ref 8.6–10.4)
Creat: 0.76 mg/dL (ref 0.50–0.99)
GFR, Est African American: 93 mL/min/{1.73_m2} (ref >=60)
GFR, Est Non African American: 80 mL/min/{1.73_m2} (ref >=60)
GLUCOSE: 90 mg/dL (ref 65–99)
Globulin: 2.2 g/dL (calc) (ref 1.9–3.7)
Potassium: 4.4 mmol/L (ref 3.5–5.3)
Sodium: 141 mmol/L (ref 135–146)
Total Protein: 6.6 g/dL (ref 6.1–8.1)

## 2017-02-24 NOTE — Assessment & Plan Note (Signed)
Elevated today Likely white coat as patient states this is normally well controlled Recheck at next visit Check CMP

## 2017-02-25 ENCOUNTER — Ambulatory Visit
Admission: RE | Admit: 2017-02-25 | Discharge: 2017-02-25 | Disposition: A | Discharge status: Home / Self Care | Payer: Medicare Other | Source: Ambulatory Visit | Attending: Family Medicine | Admitting: Family Medicine

## 2017-02-25 DIAGNOSIS — Z1231 Encounter for screening mammogram for malignant neoplasm of breast: Secondary | ICD-10-CM | POA: Diagnosis present

## 2017-03-09 ENCOUNTER — Other Ambulatory Visit: Payer: Self-pay | Admitting: *Deleted

## 2017-03-09 ENCOUNTER — Inpatient Hospital Stay
Admission: RE | Admit: 2017-03-09 | Discharge: 2017-03-09 | Disposition: A | Discharge status: Home / Self Care | Payer: Self-pay | Source: Ambulatory Visit | Attending: *Deleted | Admitting: *Deleted

## 2017-03-09 DIAGNOSIS — Z9289 Personal history of other medical treatment: Secondary | ICD-10-CM

## 2017-03-10 ENCOUNTER — Telehealth: Payer: Self-pay

## 2017-03-10 NOTE — Telephone Encounter (Signed)
-----   Message from Erasmo DownerAngela M Bacigalupo, MD sent at 03/10/2017  9:37 AM EST ----- Normal mammogram  Bacigalupo, Marzella SchleinAngela M, MD, MPH [facility] 03/10/2017 9:37 AM

## 2017-03-10 NOTE — Telephone Encounter (Signed)
Left message advising pt. OK per DPR. 

## 2017-05-24 ENCOUNTER — Encounter: Payer: Self-pay | Admitting: Podiatry

## 2017-05-24 ENCOUNTER — Ambulatory Visit: Payer: Medicare Other | Admitting: Podiatry

## 2017-05-24 DIAGNOSIS — L600 Ingrowing nail: Secondary | ICD-10-CM

## 2017-05-24 NOTE — Progress Notes (Signed)
   Subjective:    Patient ID: Melissa Barry, female    DOB: 02/18/1946, 72 y.o.   MRN: [id number]  HPI: She presents today with a chief complaint of what she thinks is an ingrown toenail to the fibular border hallux right.  She states that is been like this on and off for the past year.  She states that the nail began to look like this when she had to clip the nail Deep one time and pull out the spicule of nail.  She states that since that time the nail curves down into her skin becomes sore.  She uses Neosporin for relief and it seems to have relieved it.  She is also concerned about a possible nail fungus due to white patches on her toenails.  She states that she has just removed long-term dark finger nail polish.    Review of Systems  Musculoskeletal: Positive for arthralgias.  All other systems reviewed and are negative.      Objective:   Physical Exam: Vital signs are stable alert and oriented x3.  Pulses are strongly palpable.  Neurologic sensorium is intact per Semmes Weinstein monofilament.  Deep tendon reflexes are intact bilateral.  Muscle strength +5/5 dorsiflexors plantar flexors inverters everters all intrinsic musculature is intact.  Orthopedic evaluation demonstrates all joints distal to the ankle full range of motion without crepitation.  Cutaneous evaluation demonstrates supple well-hydrated cutis with exception of sharp incurvated nail margin along the fibular border of the hallux right.  There is a nail dystrophy associated with this resulting in the ingrown nail portion.  There are white patches on the nail plates which appear to be nail binder.  I was able to demonstrate this by using a bur on the top of the nail.  This does not look like a superficial white onychomycosis rather just a nail plate that has been painted for too long.        Assessment & Plan:  Mild paronychia with ingrown nail hallux right.  Plan: Discussed etiology pathology conservative versus surgical  therapies.  At this point I trimmed the margin out to alleviate her symptoms for her.  She is about to go [place] a trip to [facility] and does not want to have any type of surgical procedure performed today.  I will follow-up with her in the near future for a chemical matrixectomy to the fibular border of the hallux right.

## 2017-06-30 ENCOUNTER — Ambulatory Visit: Payer: Medicare Other | Admitting: Podiatry

## 2017-09-20 ENCOUNTER — Emergency Department: Payer: Medicare Other

## 2017-09-20 ENCOUNTER — Emergency Department
Admission: EM | Admit: 2017-09-20 | Discharge: 2017-09-21 | Disposition: A | Discharge status: Home / Self Care | Payer: Medicare Other | Attending: Emergency Medicine | Admitting: Emergency Medicine

## 2017-09-20 ENCOUNTER — Other Ambulatory Visit: Payer: Self-pay

## 2017-09-20 ENCOUNTER — Encounter: Payer: Self-pay | Admitting: Emergency Medicine

## 2017-09-20 DIAGNOSIS — Z87891 Personal history of nicotine dependence: Secondary | ICD-10-CM | POA: Diagnosis not present

## 2017-09-20 DIAGNOSIS — R002 Palpitations: Secondary | ICD-10-CM | POA: Diagnosis present

## 2017-09-20 LAB — BASIC METABOLIC PANEL
Anion gap: 11 (ref 5–15)
BUN: 22 mg/dL — AB (ref 6–20)
CALCIUM: 9.3 mg/dL (ref 8.9–10.3)
CO2: 22 mmol/L (ref 22–32)
Chloride: 110 mmol/L (ref 101–111)
Creatinine, Ser: 0.79 mg/dL (ref 0.44–1.00)
GFR calc Af Amer: >60 mL/min (ref >60)
GLUCOSE: 137 mg/dL — AB (ref 65–99)
POTASSIUM: 3.5 mmol/L (ref 3.5–5.1)
Sodium: 143 mmol/L (ref 135–145)

## 2017-09-20 LAB — CBC
HEMATOCRIT: 43.6 % (ref 35.0–47.0)
HEMOGLOBIN: 15 g/dL (ref 12.0–16.0)
MCH: 31.1 pg (ref 26.0–34.0)
MCHC: 34.4 g/dL (ref 32.0–36.0)
MCV: 90.5 fL (ref 80.0–100.0)
Platelets: 204 10*3/uL (ref 150–440)
RBC: 4.82 MIL/uL (ref 3.80–5.20)
RDW: 13.3 % (ref 11.5–14.5)
WBC: 6 10*3/uL (ref 3.6–11.0)

## 2017-09-20 LAB — TROPONIN I: Troponin I: <0.03 ng/mL (ref <0.03)

## 2017-09-20 NOTE — ED Triage Notes (Signed)
Patient ambulatory to triage with steady gait, without difficulty or distress noted; pt reports "I feel like my heart is not doing what it's supposed to be doing"; denies pain or other accomp symptoms

## 2017-09-21 NOTE — ED Provider Notes (Signed)
[facility] Emergency Department Provider Note    First MD Initiated Contact with Patient 09/20/17 2345     (approximate)  I have reviewed the triage vital signs and the nursing notes.   HISTORY  Chief Complaint Palpitations    HPI Melissa Barry is a 72 y.o. female below list of chronic medical conditions presents to the emergency department with episodic heart palpitations which she describes as a regular lasting approximately 20 minutes each episode.  Patient states that she had a episode 2 weeks ago followed by spontaneous resolution.  Patient states that episode occurred after drinking a wine cooler.  Patient states the episode tonight occurred after drinking a half of a beer again similar symptom lasting approximately 20 minutes.  Patient denies any chest pain no shortness of breath   Past Medical History:  Diagnosis Date  . Medical history non-contributory     Patient Active Problem List   Diagnosis Date Noted  . Elevated BP without diagnosis of hypertension 02/24/2017  . Special screening for malignant neoplasms, colon   . Constipation 06/25/2015  . Hypercholesterolemia 06/13/2015  . 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 Arthritis, degenerative 06/13/2015  . 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 BREAST BIOPSY Left years ago   Dr. Lemar LivingsByrnett   . COLONOSCOPY WITH PROPOFOL N/A 12/17/2015   Procedure: COLONOSCOPY WITH PROPOFOL;  Surgeon: Midge Miniumarren Wohl, MD;  Location: [facility] ENDOSCOPY;  Service: Endoscopy;  Laterality: N/A;  . DILATION AND CURETTAGE OF UTERUS    . TONSILLECTOMY AND ADENOIDECTOMY      Prior to Admission medications   Not on File    Allergies Doxycycline; Penicillins; and Sulfa antibiotics  Family History  Problem Relation Age of Onset  . Ovarian cancer Mother   . Heart disease Father   . Lung cancer Father   . Thyroid disease Father   . Congestive Heart Failure Father   . Hepatitis  Sister   . Celiac disease Sister   . Migraines Daughter   . Migraines Son   . Sarcoidosis Son   . Parkinson's disease Maternal Grandfather   . Stroke Paternal Grandmother   . Lung cancer Paternal Grandfather     Social History Social History   Tobacco Use  . Smoking status: Former Smoker    Years: 2.00    Types: Cigarettes  . Smokeless tobacco: Never Used  Substance Use Topics  . Alcohol use: Yes    Alcohol/week: 0.6 - 1.2 oz    Types: 1 - 2 Cans of beer per week  . Drug use: No    Review of Systems Constitutional: No fever/chills Eyes: No visual changes. ENT: No sore throat. Cardiovascular: Denies chest pain. Respiratory: Denies shortness of breath. Gastrointestinal: No abdominal pain.  No nausea, no vomiting.  No diarrhea.  No constipation. Genitourinary: Negative for dysuria. Musculoskeletal: Negative for neck pain.  Negative for back pain. Integumentary: Negative for rash. Neurological: Negative for headaches, focal weakness or numbness.   ____________________________________________   PHYSICAL EXAM:  VITAL SIGNS: ED Triage Vitals [09/20/17 2104]  Enc Vitals Group     BP (!) 180/86     Pulse Rate 89     Resp 18     Temp 98.4 F (36.9 C)     Temp Source Oral     SpO2 95 %     Weight 86.6 kg (191 lb)     Height 1.676 m (5\' 6" )     Head  Circumference      Peak Flow      Pain Score 0     Pain Loc      Pain Edu?      Excl. in GC?     Constitutional: Alert and oriented. Well appearing and in no acute distress. Eyes: Conjunctivae are normal. Head: Atraumatic. Mouth/Throat: Mucous membranes are moist. Oropharynx non-erythematous. Neck: No stridor.   Cardiovascular: Normal rate, regular rhythm. Good peripheral circulation. Grossly normal heart sounds. Respiratory: Normal respiratory effort.  No retractions. Lungs CTAB. Gastrointestinal: Soft and nontender. No distention.  Musculoskeletal: No lower extremity tenderness nor edema. No gross deformities  of extremities. Neurologic:  Normal speech and language. No gross focal neurologic deficits are appreciated.  Skin:  Skin is warm, dry and intact. No rash noted. Psychiatric: Mood and affect are normal. Speech and behavior are normal.  ____________________________________________   LABS (all labs ordered are listed, but only abnormal results are displayed)  Labs Reviewed  BASIC METABOLIC PANEL - Abnormal; Notable for the following components:      Result Value   Glucose, Bld 137 (*)    BUN 22 (*)    All other components within normal limits  CBC  TROPONIN I   ____________________________________________  EKG  ED ECG REPORT I, [place] N BROWN, the attending physician, personally viewed and interpreted this ECG.   Date: 09/21/2017  EKG Time: 10:06 PM  Rate: 87  Rhythm: Normal sinus rhythm  Axis: Normal  Intervals: Normal  ST&T Change: None  ____________________________________________  RADIOLOGY I, [place] N BROWN, personally viewed and evaluated these images (plain radiographs) as part of my medical decision making, as well as reviewing the written report by the radiologist.  ED MD interpretation: Acute cardiopulmonary disease noted per radiologist on chest x-ray  Official radiology report(s): Dg Chest 2 View  Result Date: 09/20/2017 CLINICAL DATA:  Initial evaluation for acute chest pain. EXAM: CHEST - 2 VIEW COMPARISON:  None available. FINDINGS: The cardiac and mediastinal silhouettes are within normal limits. The lungs are normally inflated. No airspace consolidation, pleural effusion, or pulmonary edema is identified. There is no pneumothorax. No acute osseous abnormality identified. IMPRESSION: No active cardiopulmonary disease. Electronically Signed   By: Rise Mu M.D.   On: 09/20/2017 21:41      Procedures   ____________________________________________   INITIAL IMPRESSION / ASSESSMENT AND PLAN / ED COURSE  As part of my medical decision  making, I reviewed the following data within the electronic MEDICAL RECORD NUMBER   72 year old female presented with above-stated history and physical exam secondary to heart palpitations.  Patient's EKG revealed no evidence of arrhythmia likewise patient was observed on cardiac monitor in the emergency department with only normal sinus rhythm noted.  Patient's laboratory data unremarkable.  Spoke with the patient and her husband at length regarding the necessity of following up with cardiology for further outpatient evaluation and management.  Patient's husband requested Dr. Lady Gary ____________________________________________  FINAL CLINICAL IMPRESSION(S) / ED DIAGNOSES  Final diagnoses:  Heart palpitations     MEDICATIONS GIVEN DURING THIS VISIT:  Medications - No data to display   ED Discharge Orders    None       Note:  This document was prepared using Dragon voice recognition software and may include unintentional dictation errors.    Darci Current, MD 09/21/17 [phone]

## 2017-10-15 DIAGNOSIS — R0602 Shortness of breath: Secondary | ICD-10-CM | POA: Insufficient documentation

## 2017-10-15 DIAGNOSIS — I479 Paroxysmal tachycardia, unspecified: Secondary | ICD-10-CM | POA: Insufficient documentation

## 2017-10-15 DIAGNOSIS — I480 Paroxysmal atrial fibrillation: Secondary | ICD-10-CM | POA: Insufficient documentation

## 2018-02-24 ENCOUNTER — Ambulatory Visit (INDEPENDENT_AMBULATORY_CARE_PROVIDER_SITE_OTHER): Payer: Medicare Other

## 2018-02-24 ENCOUNTER — Ambulatory Visit (INDEPENDENT_AMBULATORY_CARE_PROVIDER_SITE_OTHER): Payer: Medicare Other | Admitting: Family Medicine

## 2018-02-24 ENCOUNTER — Encounter: Payer: Self-pay | Admitting: Family Medicine

## 2018-02-24 VITALS — BP 148/72 | HR 61 | Temp 98.8°F | Ht 65.0 in | Wt 192.0 lb

## 2018-02-24 VITALS — BP 141/80 | HR 61 | Temp 98.8°F | Ht 65.0 in | Wt 192.0 lb

## 2018-02-24 DIAGNOSIS — R030 Elevated blood-pressure reading, without diagnosis of hypertension: Secondary | ICD-10-CM | POA: Diagnosis not present

## 2018-02-24 DIAGNOSIS — M7661 Achilles tendinitis, right leg: Secondary | ICD-10-CM | POA: Diagnosis not present

## 2018-02-24 DIAGNOSIS — G6289 Other specified polyneuropathies: Secondary | ICD-10-CM

## 2018-02-24 DIAGNOSIS — Z1239 Encounter for other screening for malignant neoplasm of breast: Secondary | ICD-10-CM

## 2018-02-24 DIAGNOSIS — G629 Polyneuropathy, unspecified: Secondary | ICD-10-CM | POA: Insufficient documentation

## 2018-02-24 DIAGNOSIS — Z0000 Encounter for general adult medical examination without abnormal findings: Secondary | ICD-10-CM

## 2018-02-24 DIAGNOSIS — M7662 Achilles tendinitis, left leg: Secondary | ICD-10-CM

## 2018-02-24 DIAGNOSIS — E7800 Pure hypercholesterolemia, unspecified: Secondary | ICD-10-CM

## 2018-02-24 NOTE — Assessment & Plan Note (Signed)
Discussed lifestyle interventions Recheck FLP and CMP Reconsider statin pending ASCVD risk

## 2018-02-24 NOTE — Assessment & Plan Note (Signed)
Minimal with normal exam Discussed gabapentin, but patient prefers not to take a medication Will check B12 level Return precautions discussed

## 2018-02-24 NOTE — Progress Notes (Signed)
Subjective:   Melissa Barry is a 72 y.o. female who presents for Medicare Annual (Subsequent) preventive examination.  Review of Systems:  N/A  Cardiac Risk Factors include: advanced age (>36men, >62 women);obesity (BMI >30kg/m2)     Objective:     Vitals: BP (!) 148/72 (BP Location: Right Arm)   Pulse 61   Temp 98.8 F (37.1 C) (Oral)   Ht 5\' 5"  (1.651 m)   Wt 192 lb (87.1 kg)   BMI 31.95 kg/m   Body mass index is 31.95 kg/m.  Advanced Directives 02/24/2018 02/23/2017 12/17/2015 09/24/2015  Does Patient Have a Medical Advance Directive? No No No Yes  Type of Advance Directive - - Web designer;Living will  Would patient like information on creating a medical advance directive? No - Patient declined No - Patient declined - -    Tobacco Social History   Tobacco Use  Smoking Status Former Smoker  . Years: 2.00  . Types: Cigarettes  Smokeless Tobacco Never Used  Tobacco Comment   only smoked in college     Counseling given: Not Answered Comment: only smoked in college   Clinical Intake:  Pre-visit preparation completed: Yes  Pain : No/denies pain Pain Score: 0-No pain     Nutritional Status: BMI > 30  Obese Nutritional Risks: None Diabetes: No  How often do you need to have someone help you when you read instructions, pamphlets, or other written materials from your doctor or pharmacy?: 1 - Never  Interpreter Needed?: No  Information entered by :: [facility], LPN  Past Medical History:  Diagnosis Date  . Medical history non-contributory    Past Surgical History:  Procedure Laterality Date  . BREAST BIOPSY Left years ago   Dr. Lemar Livings   . COLONOSCOPY WITH PROPOFOL N/A 12/17/2015   Procedure: COLONOSCOPY WITH PROPOFOL;  Surgeon: Midge Minium, MD;  Location: [facility] ENDOSCOPY;  Service: Endoscopy;  Laterality: N/A;  . DILATION AND CURETTAGE OF UTERUS    . TONSILLECTOMY AND ADENOIDECTOMY     Family History  Problem Relation Age of Onset    . Ovarian cancer Mother   . Heart disease Father   . Lung cancer Father   . Thyroid disease Father   . Congestive Heart Failure Father   . Hepatitis Sister   . Celiac disease Sister   . Migraines Daughter   . Migraines Son   . Sarcoidosis Son   . Parkinson's disease Maternal Grandfather   . Stroke Paternal Grandmother   . Lung cancer Paternal Grandfather    Social History   Socioeconomic History  . Marital status: Married    Spouse name: Not on file  . Number of children: 2  . Years of education: Not on file  . Highest education level: Some college, no degree  Occupational History  . Not on file  Social Needs  . Financial resource strain: Not hard at all  . Food insecurity:    Worry: Never true    Inability: Never true  . Transportation needs:    Medical: No    Non-medical: No  Tobacco Use  . Smoking status: Former Smoker    Years: 2.00    Types: Cigarettes  . Smokeless tobacco: Never Used  . Tobacco comment: only smoked in college  Substance and Sexual Activity  . Alcohol use: Yes    Alcohol/week: 1.0 - 2.0 standard drinks    Types: 1 - 2 Cans of beer per week  . Drug use: No  .  Sexual activity: Not on file  Lifestyle  . Physical activity:    Days per week: Not on file    Minutes per session: Not on file  . Stress: Not at all  Relationships  . Social connections:    Talks on phone: Patient refused    Gets together: Patient refused    Attends religious service: Patient refused    Active member of club or organization: Patient refused    Attends meetings of clubs or organizations: Patient refused    Relationship status: Patient refused  Other Topics Concern  . Not on file  Social History Narrative  . Not on file    Outpatient Encounter Medications as of 02/24/2018  Medication Sig  . naproxen (NAPROSYN) 500 MG tablet Take 500 mg by mouth as needed.    No facility-administered encounter medications on file as of 02/24/2018.     Activities of Daily  Living In your present state of health, do you have any difficulty performing the following activities: 02/24/2018  Hearing? N  Vision? N  Comment Wears eyeglasses.   Difficulty concentrating or making decisions? N  Walking or climbing stairs? Y  Comment Due to leg pains.   Dressing or bathing? N  Doing errands, shopping? N  Preparing Food and eating ? N  Using the Toilet? N  In the past six months, have you accidently leaked urine? N  Do you have problems with loss of bowel control? N  Managing your Medications? N  Managing your Finances? N  Housekeeping or managing your Housekeeping? N  Some recent data might be hidden    Patient Care Team: Erasmo Downer, MD as PCP - General (Family Medicine) Juanell Fairly, MD as Referring Physician (Orthopedic Surgery) Galen Manila, MD as Referring Physician (Ophthalmology)    Assessment:   This is a routine wellness examination for Melissa Barry.  Exercise Activities and Dietary recommendations Current Exercise Habits: The patient does not participate in regular exercise at present, Exercise limited by: None identified  Goals    . Exercise 3x per week (30 min per time)     Recommend doing some form of exercise for 3 days a week for at least 30 minutes.   02/24/18, Continue trying to walk or exercise for 3 days a week for at least 30 mins a day.        Fall Risk Fall Risk  02/24/2018 02/23/2017 09/24/2015  Falls in the past year? 0 No No   FALL RISK PREVENTION PERTAINING TO THE HOME:  Any stairs in or around the home WITH handrails? Yes  Home free of loose throw rugs in walkways, pet beds, electrical cords, etc? Yes  Adequate lighting in your home to reduce risk of falls? Yes   ASSISTIVE DEVICES UTILIZED TO PREVENT FALLS:  Life alert? No  Use of a cane, walker or w/c? No  Grab bars in the bathroom? No  Shower chair or bench in shower? No  Elevated toilet seat or a handicapped toilet? No    TIMED UP AND GO:  Was the  test performed? No .    Depression Screen PHQ 2/9 Scores 02/24/2018 02/23/2017 02/23/2017 09/24/2015  PHQ - 2 Score 0 0 0 0  PHQ- 9 Score - 0 - -     Cognitive Function: Declined today.        Immunization History  Administered Date(s) Administered  . Pneumococcal Conjugate-13 09/24/2015  . Pneumococcal Polysaccharide-23 02/03/2013  . Td 06/24/2010  . Tdap 06/24/2010  Qualifies for Shingles Vaccine? Yes . Due for Shingrix. Education has been provided regarding the importance of this vaccine. Pt has been advised to call insurance company to determine out of pocket expense. Advised may also receive vaccine at local pharmacy or Health Dept. Verbalized acceptance and understanding.  Tdap: Up to date  Flu Vaccine: Due for Flu vaccine. Does the patient want to receive this vaccine today?  No . Education has been provided regarding the importance of this vaccine but still declined. Advised may receive this vaccine at local pharmacy or Health Dept. Aware to provide a copy of the vaccination record if obtained from local pharmacy or Health Dept. Verbalized acceptance and understanding.  Pneumococcal Vaccine: Up to date   Screening Tests Health Maintenance  Topic Date Due  . INFLUENZA VACCINE  07/19/2018 (Originally 11/18/2017)  . MAMMOGRAM  02/26/2019  . TETANUS/TDAP  06/23/2020  . COLONOSCOPY  12/16/2025  . DEXA SCAN  Completed  . Hepatitis C Screening  Completed  . PNA vac Low Risk Adult  Completed    Cancer Screenings:  Colorectal Screening: Completed 12/16/69. Repeat every 10 years.  Mammogram: Completed 03/09/17.  Bone Density: Completed 10/23/15. Results reflect NORMAL.   Lung Cancer Screening: (Low Dose CT Chest recommended if Age 66-80 years, 30 pack-year currently smoking OR have quit w/in 15years.) does not qualify.   Additional Screening:  Hepatitis C Screening: Up to date  Vision Screening: Recommended annual ophthalmology exams for early detection of glaucoma  and other disorders of the eye.  Dental Screening: Recommended annual dental exams for proper oral hygiene  Community Resource Referral:  CRR required this visit?  No       Plan:  I have personally reviewed and addressed the Medicare Annual Wellness questionnaire and have noted the following in the patient's chart:  A. Medical and social history B. Use of alcohol, tobacco or illicit drugs  C. Current medications and supplements D. Functional ability and status E.  Nutritional status F.  Physical activity G. Advance directives H. List of other physicians I.  Hospitalizations, surgeries, and ER visits in previous 12 months J.  Vitals K. Screenings such as hearing and vision if needed, cognitive and depression L. Referrals and appointments - none  In addition, I have reviewed and discussed with patient certain preventive protocols, quality metrics, and best practice recommendations. A written personalized care plan for preventive services as well as general preventive health recommendations were provided to patient.  See attached scanned questionnaire for additional information.   Signed,  Hyacinth Meeker, LPN Nurse Health Advisor   Nurse Recommendations: Pt declined the influenza vaccine today.

## 2018-02-24 NOTE — Progress Notes (Signed)
Patient: Melissa Barry, Female    DOB: 1945/12/31, 72 y.o.   MRN: [id number] Visit Date: 02/24/2018  Today's Provider: Shirlee Latch, MD   Chief Complaint  Patient presents with  . Annual Exam   Subjective:  I, Melissa Barry, CMA, am acting as a scribe for Shirlee Latch, MD.   Patient had a AWE with Melissa Barry prior to appointment   Complete Physical Melissa Barry is a 72 y.o. female. She feels fairly well. She C/O leg pain- burning sensation and toe numbness at night. This is worse after long periods of sitting.  Tingling of toes occurs only at night.  No RLS symptoms.  She reports exercising none. She reports she is sleeping poorly due to nocturia and urgency to urinate. These symptoms started years ago per patient.   -----------------------------------------------------------   Review of Systems  Constitutional: Negative.   HENT: Negative.   Eyes: Negative.   Respiratory: Negative.   Cardiovascular: Positive for palpitations.  Gastrointestinal: Negative.   Endocrine: Negative.   Genitourinary: Negative.   Musculoskeletal: Negative.   Skin: Negative.   Allergic/Immunologic: Negative.   Neurological: Negative.   Hematological: Negative.   Psychiatric/Behavioral: Negative.     Social History   Socioeconomic History  . Marital status: Married    Spouse name: Not on file  . Number of children: 2  . Years of education: Not on file  . Highest education level: Some college, no degree  Occupational History  . Not on file  Social Needs  . Financial resource strain: Not hard at all  . Food insecurity:    Worry: Never true    Inability: Never true  . Transportation needs:    Medical: No    Non-medical: No  Tobacco Use  . Smoking status: Former Smoker    Years: 2.00    Types: Cigarettes  . Smokeless tobacco: Never Used  . Tobacco comment: only smoked in college  Substance and Sexual Activity  . Alcohol use: Yes    Alcohol/week: 1.0 - 2.0 standard  drinks    Types: 1 - 2 Cans of beer per week  . Drug use: No  . Sexual activity: Not on file  Lifestyle  . Physical activity:    Days per week: Not on file    Minutes per session: Not on file  . Stress: Not at all  Relationships  . Social connections:    Talks on phone: Patient refused    Gets together: Patient refused    Attends religious service: Patient refused    Active member of club or organization: Patient refused    Attends meetings of clubs or organizations: Patient refused    Relationship status: Patient refused  . Intimate partner violence:    Fear of current or ex partner: Patient refused    Emotionally abused: Patient refused    Physically abused: Patient refused    Forced sexual activity: Patient refused  Other Topics Concern  . Not on file  Social History Narrative  . Not on file    Past Medical History:  Diagnosis Date  . Medical history non-contributory      Patient Active Problem List   Diagnosis Date Noted  . Tachycardia, paroxysmal (HCC) 10/15/2017  . SOB (shortness of breath) 10/15/2017  . Elevated BP without diagnosis of hypertension 02/24/2017  . Special screening for malignant neoplasms, colon   . Constipation 06/25/2015  . Hypercholesterolemia 06/13/2015  . 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 Arthritis, degenerative 06/13/2015  .  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 BREAST BIOPSY Left years ago   Dr. Lemar Livings   . COLONOSCOPY WITH PROPOFOL N/A 12/17/2015   Procedure: COLONOSCOPY WITH PROPOFOL;  Surgeon: Midge Minium, MD;  Location: [facility] ENDOSCOPY;  Service: Endoscopy;  Laterality: N/A;  . DILATION AND CURETTAGE OF UTERUS    . TONSILLECTOMY AND ADENOIDECTOMY      Her family history includes Celiac disease in her sister; Congestive Heart Failure in her father; Heart disease in her father; Hepatitis in her sister; Lung cancer in her father and paternal grandfather; Migraines in her daughter and son; Ovarian  cancer in her mother; Parkinson's disease in her maternal grandfather; Sarcoidosis in her son; Stroke in her paternal grandmother; Thyroid disease in her father.      Current Outpatient Medications:  .  naproxen (NAPROSYN) 500 MG tablet, Take 500 mg by mouth as needed. , Disp: , Rfl:   Patient Care Team: Erasmo Downer, MD as PCP - General (Family Medicine) Juanell Fairly, MD as Referring Physician (Orthopedic Surgery) Galen Manila, MD as Referring Physician (Ophthalmology)     Objective:   Vitals: BP (!) 148/72 (BP Location: Right Arm, Patient Position: Sitting, Cuff Size: Normal)   Pulse 61   Temp 98.8 F (37.1 C) (Oral)   Ht 5\' 5"  (1.651 m)   Wt 192 lb (87.1 kg)   BMI 31.95 kg/m   Physical Exam  Constitutional: She is oriented to person, place, and time. She appears well-developed and well-nourished. No distress.  HENT:  Head: Normocephalic and atraumatic.  Right Ear: External ear normal.  Left Ear: External ear normal.  Nose: Nose normal.  Mouth/Throat: Oropharynx is clear and moist.  Eyes: Pupils are equal, round, and reactive to light. Conjunctivae and EOM are normal. No scleral icterus.  Neck: Neck supple. No thyromegaly present.  Cardiovascular: Normal rate, regular rhythm, normal heart sounds and intact distal pulses.  No murmur heard. Pulmonary/Chest: Effort normal and breath sounds normal. No respiratory distress. She has no wheezes. She has no rales.  Abdominal: Soft. Bowel sounds are normal. She exhibits no distension. There is no tenderness. There is no rebound and no guarding.  Musculoskeletal: She exhibits no edema or deformity.  B/l feet/ankles: No visible erythema or swelling. Strength is 5/5 in all directions. Stable lateral and medial ligaments Talar dome nontender; No pain at base of 5th MT; No tenderness over cuboid; No tenderness over N spot or navicular prominence No tenderness on posterior aspects of lateral and medial malleolus No  sign of peroneal tendon subluxations; Negative tarsal tunnel tinel's Able to walk 4 steps. TTP over insertion of achilles tendon b/l and limited dorsiflexion.  Sensation intact grossly to light touch  Lymphadenopathy:    She has no cervical adenopathy.  Neurological: She is alert and oriented to person, place, and time.  Skin: Skin is warm and dry. Capillary refill takes less than 2 seconds. No rash noted.  Psychiatric: She has a normal mood and affect. Her behavior is normal.  Vitals reviewed.   Activities of Daily Living In your present state of health, do you have any difficulty performing the following activities: 02/24/2018  Hearing? N  Vision? N  Comment Wears eyeglasses.   Difficulty concentrating or making decisions? N  Walking or climbing stairs? Y  Comment Due to leg pains.   Dressing or bathing? N  Doing errands, shopping? N  Preparing Food and eating ? N  Using the Toilet? N  In the past six months, have you accidently leaked urine? N  Do you have problems with loss of bowel control? N  Managing your Medications? N  Managing your Finances? N  Housekeeping or managing your Housekeeping? N  Some recent data might be hidden    Fall Risk Assessment Fall Risk  02/24/2018 02/23/2017 09/24/2015  Falls in the past year? 0 No No     Depression Screen PHQ 2/9 Scores 02/24/2018 02/23/2017 02/23/2017 09/24/2015  PHQ - 2 Score 0 0 0 0  PHQ- 9 Score - 0 - -     Assessment & Plan:    Annual Physical Reviewed patient's Family Medical History Reviewed and updated list of patient's medical providers Assessment of cognitive impairment was done Assessed patient's functional ability Established a written schedule for health screening services Health Risk Assessent Completed and Reviewed  Exercise Activities and Dietary recommendations Goals    . Exercise 3x per week (30 min per time)     Recommend doing some form of exercise for 3 days a week for at least 30 minutes.    02/24/18, Continue trying to walk or exercise for 3 days a week for at least 30 mins a day.        Immunization History  Administered Date(s) Administered  . Pneumococcal Conjugate-13 09/24/2015  . Pneumococcal Polysaccharide-23 02/03/2013  . Td 06/24/2010  . Tdap 06/24/2010    Health Maintenance  Topic Date Due  . INFLUENZA VACCINE  07/19/2018 (Originally 11/18/2017)  . MAMMOGRAM  02/26/2019  . TETANUS/TDAP  06/23/2020  . COLONOSCOPY  12/16/2025  . DEXA SCAN  Completed  . Hepatitis C Screening  Completed  . PNA vac Low Risk Adult  Completed     Discussed health benefits of physical activity, and encouraged her to engage in regular exercise appropriate for her age and condition.    ------------------------------------------------------------------------------------------------------------  Problem List Items Addressed This Visit      Nervous and Auditory   Peripheral neuropathy    Minimal with normal exam Discussed gabapentin, but patient prefers not to take a medication Will check B12 level Return precautions discussed      Relevant Orders   B12     Musculoskeletal and Integument   Achilles tendinitis of both lower extremities    Exam c/w achilles tendinopathy b/l Discussed HEP, NSAIDs sparingly (regularly x2 weeks), proper footwear, and return precautions        Other   Hypercholesterolemia    Discussed lifestyle interventions Recheck FLP and CMP Reconsider statin pending ASCVD risk      Relevant Orders   Lipid panel   Comprehensive metabolic panel   Elevated BP without diagnosis of hypertension    Elevated today, but recently normal at [facility] visit Believe she has white coat hypertension Recheck at next visit Home monitoring      Relevant Orders   Comprehensive metabolic panel    Other Visit Diagnoses    Encounter for annual physical exam    -  Primary   Screening for breast cancer       Relevant Orders   MM 3D SCREEN BREAST BILATERAL        Return in about 1 year (around 02/25/2019) for AWV/CPE.   The entirety of the information documented in the History of Present Illness, Review of Systems and Physical Exam were personally obtained by me. Portions of this information were initially documented by Melissa Barry, CMA and reviewed by me for thoroughness and accuracy.    Erasmo Downer,  MD, MPH [facility] 02/24/2018 2:09 PM

## 2018-02-24 NOTE — Patient Instructions (Addendum)
 The CDC recommends two doses of Shingrix (the shingles vaccine) separated by 2 to 6 months for adults age 70 years and older. I recommend checking with your insurance plan regarding coverage for this vaccine.     Preventive Care 70 Years and Older, Female Preventive care refers to lifestyle choices and visits with your health care provider that can promote health and wellness. What does preventive care include?  A yearly physical exam. This is also called an annual well check.  Dental exams once or twice a year.  Routine eye exams. Ask your health care provider how often you should have your eyes checked.  Personal lifestyle choices, including: ? Daily care of your teeth and gums. ? Regular physical activity. ? Eating a healthy diet. ? Avoiding tobacco and drug use. ? Limiting alcohol use. ? Practicing safe sex. ? Taking low-dose aspirin every day. ? Taking vitamin and mineral supplements as recommended by your health care provider. What happens during an annual well check? The services and screenings done by your health care provider during your annual well check will depend on your age, overall health, lifestyle risk factors, and family history of disease. Counseling Your health care provider may ask you questions about your:  Alcohol use.  Tobacco use.  Drug use.  Emotional well-being.  Home and relationship well-being.  Sexual activity.  Eating habits.  History of falls.  Memory and ability to understand (cognition).  Work and work environment.  Reproductive health.  Screening You may have the following tests or measurements:  Height, weight, and BMI.  Blood pressure.  Lipid and cholesterol levels. These may be checked every 5 years, or more frequently if you are over 70 years old.  Skin check.  Lung cancer screening. You may have this screening every year starting at age 55 if you have a 30-pack-year history of smoking and currently smoke or have  quit within the past 15 years.  Fecal occult blood test (FOBT) of the stool. You may have this test every year starting at age 70.  Flexible sigmoidoscopy or colonoscopy. You may have a sigmoidoscopy every 5 years or a colonoscopy every 10 years starting at age 70.  Hepatitis C blood test.  Hepatitis B blood test.  Sexually transmitted disease (STD) testing.  Diabetes screening. This is done by checking your blood sugar (glucose) after you have not eaten for a while (fasting). You may have this done every 1-3 years.  Bone density scan. This is done to screen for osteoporosis. You may have this done starting at age 70.  Mammogram. This may be done every 1-2 years. Talk to your health care provider about how often you should have regular mammograms.  Talk with your health care provider about your test results, treatment options, and if necessary, the need for more tests. Vaccines Your health care provider may recommend certain vaccines, such as:  Influenza vaccine. This is recommended every year.  Tetanus, diphtheria, and acellular pertussis (Tdap, Td) vaccine. You may need a Td booster every 10 years.  Varicella vaccine. You may need this if you have not been vaccinated.  Zoster vaccine. You may need this after age 60.  Measles, mumps, and rubella (MMR) vaccine. You may need at least one dose of MMR if you were born in 1957 or later. You may also need a second dose.  Pneumococcal 13-valent conjugate (PCV13) vaccine. One dose is recommended after age 65.  Pneumococcal polysaccharide (PPSV23) vaccine. One dose is recommended after age 65.    Meningococcal vaccine. You may need this if you have certain conditions.  Hepatitis A vaccine. You may need this if you have certain conditions or if you travel or work in places where you may be exposed to hepatitis A.  Hepatitis B vaccine. You may need this if you have certain conditions or if you travel or work in places where you may be  exposed to hepatitis B.  Haemophilus influenzae type b (Hib) vaccine. You may need this if you have certain conditions.  Talk to your health care provider about which screenings and vaccines you need and how often you need them. This information is not intended to replace advice given to you by your health care provider. Make sure you discuss any questions you have with your health care provider. Document Released: 05/03/2015 Document Revised: 12/25/2015 Document Reviewed: 02/05/2015 Elsevier Interactive Patient Education  2018 Elsevier Inc[employer]  

## 2018-02-24 NOTE — Assessment & Plan Note (Signed)
Exam c/w achilles tendinopathy b/l Discussed HEP, NSAIDs sparingly (regularly x2 weeks), proper footwear, and return precautions

## 2018-02-24 NOTE — Assessment & Plan Note (Signed)
Elevated today, but recently normal at [facility] visit Believe she has white coat hypertension Recheck at next visit Home monitoring

## 2018-02-24 NOTE — Patient Instructions (Addendum)
Melissa Barry , Thank you for taking time to come for your Medicare Wellness Visit. I appreciate your ongoing commitment to your health goals. Please review the following plan we discussed and let me know if I can assist you in the future.   Screening recommendations/referrals: Colonoscopy: Up to date, due 11/2025 Mammogram: Up to date, due 02/2019   Bone Density: Up to date, due 10/22/25 Recommended yearly ophthalmology/optometry visit for glaucoma screening and checkup Recommended yearly dental visit for hygiene and checkup  Vaccinations: Influenza vaccine: Pt declines today.  Pneumococcal vaccine: Completed series Tdap vaccine: Up to date, due 06/2020  Shingles vaccine: Pt declines today.     Advanced directives: Advance directive discussed with you today. Even though you declined this today please call our office should you change your mind and we can give you the proper paperwork for you to fill out.  Conditions/risks identified: Continue trying to walk or exercise for 3 days a week for at least 30 mins a day.   Next appointment: 10:00 AM today with Dr Beryle Flock.    Preventive Care 70 Years and Older, Female Preventive care refers to lifestyle choices and visits with your health care provider that can promote health and wellness. What does preventive care include?  A yearly physical exam. This is also called an annual well check.  Dental exams once or twice a year.  Routine eye exams. Ask your health care provider how often you should have your eyes checked.  Personal lifestyle choices, including:  Daily care of your teeth and gums.  Regular physical activity.  Eating a healthy diet.  Avoiding tobacco and drug use.  Limiting alcohol use.  Practicing safe sex.  Taking low-dose aspirin every day.  Taking vitamin and mineral supplements as recommended by your health care provider. What happens during an annual well check? The services and screenings done by your  health care provider during your annual well check will depend on your age, overall health, lifestyle risk factors, and family history of disease. Counseling  Your health care provider may ask you questions about your:  Alcohol use.  Tobacco use.  Drug use.  Emotional well-being.  Home and relationship well-being.  Sexual activity.  Eating habits.  History of falls.  Memory and ability to understand (cognition).  Work and work Astronomer.  Reproductive health. Screening  You may have the following tests or measurements:  Height, weight, and BMI.  Blood pressure.  Lipid and cholesterol levels. These may be checked every 5 years, or more frequently if you are over 74 years old.  Skin check.  Lung cancer screening. You may have this screening every year starting at age 61 if you have a 30-pack-year history of smoking and currently smoke or have quit within the past 15 years.  Fecal occult blood test (FOBT) of the stool. You may have this test every year starting at age 81.  Flexible sigmoidoscopy or colonoscopy. You may have a sigmoidoscopy every 5 years or a colonoscopy every 10 years starting at age 47.  Hepatitis C blood test.  Hepatitis B blood test.  Sexually transmitted disease (STD) testing.  Diabetes screening. This is done by checking your blood sugar (glucose) after you have not eaten for a while (fasting). You may have this done every 1-3 years.  Bone density scan. This is done to screen for osteoporosis. You may have this done starting at age 94.  Mammogram. This may be done every 1-2 years. Talk to your health care provider  about how often you should have regular mammograms. Talk with your health care provider about your test results, treatment options, and if necessary, the need for more tests. Vaccines  Your health care provider may recommend certain vaccines, such as:  Influenza vaccine. This is recommended every year.  Tetanus, diphtheria, and  acellular pertussis (Tdap, Td) vaccine. You may need a Td booster every 10 years.  Zoster vaccine. You may need this after age 25.  Pneumococcal 13-valent conjugate (PCV13) vaccine. One dose is recommended after age 40.  Pneumococcal polysaccharide (PPSV23) vaccine. One dose is recommended after age 28. Talk to your health care provider about which screenings and vaccines you need and how often you need them. This information is not intended to replace advice given to you by your health care provider. Make sure you discuss any questions you have with your health care provider. Document Released: 05/03/2015 Document Revised: 12/25/2015 Document Reviewed: 02/05/2015 Elsevier Interactive Patient Education  2017 [place] Prevention in the Home Falls can cause injuries. They can happen to people of all ages. There are many things you can do to make your home safe and to help prevent falls. What can I do on the outside of my home?  Regularly fix the edges of walkways and driveways and fix any cracks.  Remove anything that might make you trip as you walk through a door, such as a raised step or threshold.  Trim any bushes or trees on the path to your home.  Use bright outdoor lighting.  Clear any walking paths of anything that might make someone trip, such as rocks or tools.  Regularly check to see if handrails are loose or broken. Make sure that both sides of any steps have handrails.  Any raised decks and porches should have guardrails on the edges.  Have any leaves, snow, or ice cleared regularly.  Use sand or salt on walking paths during winter.  Clean up any spills in your garage right away. This includes oil or grease spills. What can I do in the bathroom?  Use night lights.  Install grab bars by the toilet and in the tub and shower. Do not use towel bars as grab bars.  Use non-skid mats or decals in the tub or shower.  If you need to sit down in the shower, use  a plastic, non-slip stool.  Keep the floor dry. Clean up any water that spills on the floor as soon as it happens.  Remove soap buildup in the tub or shower regularly.  Attach bath mats securely with double-sided non-slip rug tape.  Do not have throw rugs and other things on the floor that can make you trip. What can I do in the bedroom?  Use night lights.  Make sure that you have a light by your bed that is easy to reach.  Do not use any sheets or blankets that are too big for your bed. They should not hang down onto the floor.  Have a firm chair that has side arms. You can use this for support while you get dressed.  Do not have throw rugs and other things on the floor that can make you trip. What can I do in the kitchen?  Clean up any spills right away.  Avoid walking on wet floors.  Keep items that you use a lot in easy-to-reach places.  If you need to reach something above you, use a strong step stool that has a grab bar.  Keep electrical cords out of the way.  Do not use floor polish or wax that makes floors slippery. If you must use wax, use non-skid floor wax.  Do not have throw rugs and other things on the floor that can make you trip. What can I do with my stairs?  Do not leave any items on the stairs.  Make sure that there are handrails on both sides of the stairs and use them. Fix handrails that are broken or loose. Make sure that handrails are as long as the stairways.  Check any carpeting to make sure that it is firmly attached to the stairs. Fix any carpet that is loose or worn.  Avoid having throw rugs at the top or bottom of the stairs. If you do have throw rugs, attach them to the floor with carpet tape.  Make sure that you have a light switch at the top of the stairs and the bottom of the stairs. If you do not have them, ask someone to add them for you. What else can I do to help prevent falls?  Wear shoes that:  Do not have high heels.  Have  rubber bottoms.  Are comfortable and fit you well.  Are closed at the toe. Do not wear sandals.  If you use a stepladder:  Make sure that it is fully opened. Do not climb a closed stepladder.  Make sure that both sides of the stepladder are locked into place.  Ask someone to hold it for you, if possible.  Clearly mark and make sure that you can see:  Any grab bars or handrails.  First and last steps.  Where the edge of each step is.  Use tools that help you move around (mobility aids) if they are needed. These include:  Canes.  Walkers.  Scooters.  Crutches.  Turn on the lights when you go into a dark area. Replace any light bulbs as soon as they burn out.  Set up your furniture so you have a clear path. Avoid moving your furniture around.  If any of your floors are uneven, fix them.  If there are any pets around you, be aware of where they are.  Review your medicines with your doctor. Some medicines can make you feel dizzy. This can increase your chance of falling. Ask your doctor what other things that you can do to help prevent falls. This information is not intended to replace advice given to you by your health care provider. Make sure you discuss any questions you have with your health care provider. Document Released: 01/31/2009 Document Revised: 09/12/2015 Document Reviewed: 05/11/2014 Elsevier Interactive Patient Education  2017 [employer].

## 2018-02-25 ENCOUNTER — Telehealth: Payer: Self-pay

## 2018-02-25 LAB — COMPREHENSIVE METABOLIC PANEL
ALBUMIN: 4.6 g/dL (ref 3.5–4.8)
ALK PHOS: 70 IU/L (ref 39–117)
ALT: 12 IU/L (ref 0–32)
AST: 19 IU/L (ref 0–40)
Albumin/Globulin Ratio: 2.4 — ABNORMAL HIGH (ref 1.2–2.2)
BUN / CREAT RATIO: 26 (ref 12–28)
BUN: 18 mg/dL (ref 8–27)
Bilirubin Total: 0.7 mg/dL (ref 0.0–1.2)
CO2: 24 mmol/L (ref 20–29)
CREATININE: 0.7 mg/dL (ref 0.57–1.00)
Calcium: 9.6 mg/dL (ref 8.7–10.3)
Chloride: 105 mmol/L (ref 96–106)
GFR calc Af Amer: 102 mL/min/{1.73_m2} (ref >59)
GFR, EST NON AFRICAN AMERICAN: 88 mL/min/{1.73_m2} (ref >59)
GLOBULIN, TOTAL: 1.9 g/dL (ref 1.5–4.5)
Glucose: 91 mg/dL (ref 65–99)
Potassium: 4.5 mmol/L (ref 3.5–5.2)
SODIUM: 142 mmol/L (ref 134–144)
Total Protein: 6.5 g/dL (ref 6.0–8.5)

## 2018-02-25 LAB — VITAMIN B12: VITAMIN B 12: 635 pg/mL (ref 232–1245)

## 2018-02-25 LAB — LIPID PANEL
CHOLESTEROL TOTAL: 265 mg/dL — AB (ref 100–199)
Chol/HDL Ratio: 3.4 ratio (ref 0.0–4.4)
HDL: 79 mg/dL (ref >39)
LDL Calculated: 168 mg/dL — ABNORMAL HIGH (ref 0–99)
TRIGLYCERIDES: 91 mg/dL (ref 0–149)
VLDL Cholesterol Cal: 18 mg/dL (ref 5–40)

## 2018-02-25 NOTE — Telephone Encounter (Signed)
LMTCB

## 2018-02-25 NOTE — Telephone Encounter (Signed)
-----   Message from Erasmo Downer, MD sent at 02/25/2018 10:15 AM EST ----- Cholesterol is high.  10 year risk of heart disease/stroke is high at 10%. Recommend statin treatment to lower this risk.  Normal kidney function, liver function, electrolytes, B12 level.  Erasmo Downer, MD, MPH [facility] 02/25/2018 10:15 AM

## 2018-02-28 NOTE — Telephone Encounter (Signed)
LMTCB

## 2018-03-01 NOTE — Telephone Encounter (Signed)
LVMTRC 

## 2018-03-07 ENCOUNTER — Telehealth: Payer: Self-pay | Admitting: Family Medicine

## 2018-03-07 NOTE — Telephone Encounter (Signed)
Patient advised. She prefers to work on Lifestyle changes instead of starting a statin. Patient scheduled for a 6 month follow up to recheck labs.

## 2018-03-07 NOTE — Telephone Encounter (Signed)
Pt returning missed call for lab results.  Thanks, [employer]

## 2018-04-28 ENCOUNTER — Ambulatory Visit
Admission: RE | Admit: 2018-04-28 | Discharge: 2018-04-28 | Disposition: A | Discharge status: Home / Self Care | Payer: Medicare Other | Source: Ambulatory Visit | Attending: Family Medicine | Admitting: Family Medicine

## 2018-04-28 DIAGNOSIS — Z1231 Encounter for screening mammogram for malignant neoplasm of breast: Secondary | ICD-10-CM | POA: Diagnosis not present

## 2018-04-28 DIAGNOSIS — Z1239 Encounter for other screening for malignant neoplasm of breast: Secondary | ICD-10-CM

## 2018-09-06 ENCOUNTER — Ambulatory Visit: Payer: Medicare Other | Admitting: Family Medicine

## 2018-09-14 ENCOUNTER — Ambulatory Visit: Payer: Medicare Other | Admitting: Physician Assistant

## 2018-09-14 ENCOUNTER — Other Ambulatory Visit: Payer: Self-pay

## 2018-09-14 ENCOUNTER — Encounter: Payer: Self-pay | Admitting: Physician Assistant

## 2018-09-14 VITALS — BP 122/86 | Temp 98.3°F | Resp 16 | Wt 190.0 lb

## 2018-09-14 DIAGNOSIS — E7800 Pure hypercholesterolemia, unspecified: Secondary | ICD-10-CM | POA: Diagnosis not present

## 2018-09-14 DIAGNOSIS — R030 Elevated blood-pressure reading, without diagnosis of hypertension: Secondary | ICD-10-CM | POA: Diagnosis not present

## 2018-09-14 DIAGNOSIS — M7989 Other specified soft tissue disorders: Secondary | ICD-10-CM

## 2018-09-14 NOTE — Patient Instructions (Signed)

## 2018-09-14 NOTE — Progress Notes (Signed)
Patient: Melissa HatcherJanie H Mallery Female    DOB: 11/10/1945   73 y.o.   MRN: [id number] Visit Date: 09/14/2018  Today's Provider: Trey SailorsAdriana M Siomara Burkel, PA-C   Chief Complaint  Patient presents with  . Hypertension  . Hyperlipidemia  . Foot Swelling   Subjective:     HPI  Hypertension, follow-up:  BP Readings from Last 3 Encounters:  09/14/18 122/86  02/24/18 (!) 141/80  02/24/18 (!) 148/72    She was last seen for hypertension 6 months ago.  BP at that visit was 141/80. Management changes since that visit include none. She is not exercising. She is not adherent to low salt diet.   Outside blood pressures are not being checked. She is experiencing none.  Patient denies chest pain, chest pressure/discomfort, claudication, dyspnea, exertional chest pressure/discomfort, fatigue, irregular heart beat, lower extremity edema, near-syncope, orthopnea, palpitations, paroxysmal nocturnal dyspnea, syncope and tachypnea.   Cardiovascular risk factors include advanced age (older than 71 for men, 71 for women) and smoking/ tobacco exposure.  Use of agents associated with hypertension: none.     Weight trend: stable Wt Readings from Last 3 Encounters:  09/14/18 190 lb (86.2 kg)  02/24/18 192 lb (87.1 kg)  02/24/18 192 lb (87.1 kg)    Current diet: in general, a "healthy" diet    ------------------------------------------------------------------------  Lipid/Cholesterol, Follow-up:   Last seen for this6 months ago.  Management changes since that visit include discussing lifestyle interventions, consider statin. She was recommended to start statin medication at that time and she declined. Continues to decline statin medication. Reports she is doing apple cider vinegar. She is eating a dill pickle daily for this as well in addition to cara cara oranges.  . Last Lipid Panel:    Component Value Date/Time   CHOL 265 (H) 02/24/2018 1107   TRIG 91 02/24/2018 1107   HDL 79 02/24/2018 1107    CHOLHDL 3.4 02/24/2018 1107   CHOLHDL 3.2 02/24/2017 0853   LDLCALC 168 (H) 02/24/2018 1107   LDLCALC 156 (H) 02/24/2017 0853    Risk factors for vascular disease include smoking Weight trend: stable Prior visit with dietician: no Current diet: in general, a "healthy" diet   Current exercise: none  Wt Readings from Last 3 Encounters:  09/14/18 190 lb (86.2 kg)  02/24/18 192 lb (87.1 kg)  02/24/18 192 lb (87.1 kg)    -------------------------------------------------------------------  Foot Swelling Patient would like to address swelling in both feet, she states that at last office visit she was told swelling was due to tendinitis and states that swelling has not improved. Patient reports swelling is worse in the PM and states that swelling was just in her feet but is now spreading up to her ankles.   Allergies  Allergen Reactions  . Doxycycline Other (See Comments)    Made her feel sick the whole time she was taking it.   Marland Kitchen. Penicillins Rash  . Sulfa Antibiotics Swelling and Rash    Patient states throat swelling     No current outpatient medications on file.  Review of Systems  Social History   Tobacco Use  . Smoking status: Former Smoker    Years: 2.00    Types: Cigarettes  . Smokeless tobacco: Never Used  . Tobacco comment: only smoked in college  Substance Use Topics  . Alcohol use: Yes    Alcohol/week: 1.0 - 2.0 standard drinks    Types: 1 - 2 Cans of beer per week  Objective:   BP 122/86   Temp 98.3 F (36.8 C) (Oral)   Resp 16   Wt 190 lb (86.2 kg)   BMI 31.62 kg/m  Vitals:   09/14/18 0833  BP: 122/86  Resp: 16  Temp: 98.3 F (36.8 C)  TempSrc: Oral  Weight: 190 lb (86.2 kg)     Physical Exam Constitutional:      Appearance: Normal appearance.  Cardiovascular:     Rate and Rhythm: Normal rate and regular rhythm.     Heart sounds: Normal heart sounds.  Pulmonary:     Effort: Pulmonary effort is normal.     Breath sounds: Normal  breath sounds.  Musculoskeletal:        General: Swelling present. No tenderness.     Comments: Mild non pitting edema in bilateral lower extremities. Varicosities present bilaterally.   Skin:    General: Skin is warm and dry.  Neurological:     Mental Status: She is alert and oriented to person, place, and time. Mental status is at baseline.  Psychiatric:        Mood and Affect: Mood normal.        Behavior: Behavior normal.         Assessment & Plan    1. Elevated BP without diagnosis of hypertension  Patient does not wish to do any medications. She is normotensive today and asymptomatic.   2. Hypercholesterolemia  Does not want statin. Check labs today. She wants to continue drinking apple cider, eating dill pickles and oranges daily. Have explained that she is at increased risk of heart attack and stroke and that statin would lower risk of dying from the same. She understands these risks and continues to refuse statin.   - Lipid Profile  3. Foot swelling  Some varicosities, swelling improves in the morning. Suspect varicosities causing edema. She doesn't to do compression stockings. Monitor.   The entirety of the information documented in the History of Present Illness, Review of Systems and Physical Exam were personally obtained by me. Portions of this information were initially documented by Sheliah Hatch, CMA and reviewed by me for thoroughness and accuracy.          Trey Sailors, PA-C  [facility] [place] as a scribe for Trey Sailors, PA-C.,have documented all relevant documentation on the behalf of Trey Sailors, PA-C,as directed by  Trey Sailors, PA-C while in the presence of Trey Sailors, PA-C.

## 2018-09-15 LAB — LIPID PANEL
Chol/HDL Ratio: 3.5 ratio (ref 0.0–4.4)
Cholesterol, Total: 260 mg/dL — ABNORMAL HIGH (ref 100–199)
HDL: 74 mg/dL (ref >39)
LDL Calculated: 170 mg/dL — ABNORMAL HIGH (ref 0–99)
Triglycerides: 78 mg/dL (ref 0–149)
VLDL Cholesterol Cal: 16 mg/dL (ref 5–40)

## 2018-09-20 ENCOUNTER — Telehealth: Payer: Self-pay

## 2018-09-20 NOTE — Telephone Encounter (Signed)
-----   Message from Trey Sailors, [place] sent at 09/15/2018  8:21 AM EDT ----- Cholesterol no different. Patient declines statin. Follow up with Dr. Leonard Schwartz.

## 2018-09-20 NOTE — Telephone Encounter (Signed)
Patient viewed results on 09/20/2018 @9 :20 am. sd

## 2019-02-28 NOTE — Progress Notes (Signed)
Subjective:   Melissa Barry is a 73 y.o. female who presents for Medicare Annual (Subsequent) preventive examination.  Review of Systems:  N/A  Cardiac Risk Factors include: advanced age (>56men, >15 women);obesity (BMI >30kg/m2)     Objective:     Vitals: BP 132/82 (BP Location: Right Arm)   Pulse 65   Temp 98.4 F (36.9 C) (Oral)   Ht 5\' 5"  (1.651 m)   Wt 194 lb 9.6 oz (88.3 kg)   BMI 32.38 kg/m   Body mass index is 32.38 kg/m.  Advanced Directives 03/01/2019 02/24/2018 02/23/2017 12/17/2015 09/24/2015  Does Patient Have a Medical Advance Directive? No No No No Yes  Type of Advance Directive - - - - 11/24/2015;Living will  Would patient like information on creating a medical advance directive? No - Patient declined No - Patient declined No - Patient declined - -    Tobacco Social History   Tobacco Use  Smoking Status Former Smoker  . Years: 2.00  . Types: Cigarettes  Smokeless Tobacco Never Used  Tobacco Comment   only smoked in college     Counseling given: Not Answered Comment: only smoked in college   Clinical Intake:  Pre-visit preparation completed: Yes  Pain : No/denies pain Pain Score: 0-No pain     Nutritional Status: BMI > 30  Obese Nutritional Risks: None Diabetes: No  How often do you need to have someone help you when you read instructions, pamphlets, or other written materials from your doctor or pharmacy?: 1 - Never  Interpreter Needed?: No  Information entered by :: [facility], LPN  Past Medical History:  Diagnosis Date  . Medical history non-contributory    Past Surgical History:  Procedure Laterality Date  . BREAST BIOPSY Left years ago   [facility]   . COLONOSCOPY WITH PROPOFOL N/A 12/17/2015   Procedure: COLONOSCOPY WITH PROPOFOL;  Surgeon: 12/19/2015, MD;  Location: [facility] ENDOSCOPY;  Service: Endoscopy;  Laterality: N/A;  . DILATION AND CURETTAGE OF UTERUS    . TONSILLECTOMY AND ADENOIDECTOMY     Family  History  Problem Relation Age of Onset  . Ovarian cancer Mother   . Heart disease Father   . Lung cancer Father   . Thyroid disease Father   . Congestive Heart Failure Father   . Hepatitis Sister   . Celiac disease Sister   . Migraines Daughter   . Migraines Son   . Sarcoidosis Son   . Parkinson's disease Maternal Grandfather   . Stroke Paternal Grandmother   . Lung cancer Paternal Grandfather    Social History   Socioeconomic History  . Marital status: Married    Spouse name: Not on file  . Number of children: 2  . Years of education: Not on file  . Highest education level: Some college, no degree  Occupational History  . Not on file  Social Needs  . Financial resource strain: Not hard at all  . Food insecurity    Worry: Never true    Inability: Never true  . Transportation needs    Medical: No    Non-medical: No  Tobacco Use  . Smoking status: Former Smoker    Years: 2.00    Types: Cigarettes  . Smokeless tobacco: Never Used  . Tobacco comment: only smoked in college  Substance and Sexual Activity  . Alcohol use: Yes    Alcohol/week: 0.0 - 4.0 standard drinks    Comment: a couple drinks a month  .  Drug use: No  . Sexual activity: Not on file  Lifestyle  . Physical activity    Days per week: 0 days    Minutes per session: 0 min  . Stress: Not at all  Relationships  . Social Herbalist on phone: Patient refused    Gets together: Patient refused    Attends religious service: Patient refused    Active member of club or organization: Patient refused    Attends meetings of clubs or organizations: Patient refused    Relationship status: Patient refused  Other Topics Concern  . Not on file  Social History Narrative  . Not on file    Outpatient Encounter Medications as of 03/01/2019  Medication Sig  . ibuprofen (ADVIL) 200 MG tablet Take 200 mg by mouth every 6 (six) hours as needed.   No facility-administered encounter medications on file as of  03/01/2019.     Activities of Daily Living In your present state of health, do you have any difficulty performing the following activities: 03/01/2019  Hearing? N  Vision? N  Difficulty concentrating or making decisions? N  Walking or climbing stairs? Y  Comment Due to foot pain.  Dressing or bathing? N  Doing errands, shopping? N  Preparing Food and eating ? N  Using the Toilet? N  In the past six months, have you accidently leaked urine? N  Do you have problems with loss of bowel control? N  Managing your Medications? N  Managing your Finances? N  Housekeeping or managing your Housekeeping? N  Some recent data might be hidden    Patient Care Team: Virginia Crews, MD as PCP - General (Family Medicine) Thornton Park, MD as Referring Physician (Orthopedic Surgery) Birder Robson, MD as Referring Physician (Ophthalmology)    Assessment:   This is a routine wellness examination for [place].  Exercise Activities and Dietary recommendations Current Exercise Habits: The patient does not participate in regular exercise at present, Exercise limited by: Other - see comments(foot pain)  Goals    . Exercise 3x per week (30 min per time)     Recommend doing some form of exercise for 3 days a week for at least 30 minutes.   02/24/18, Continue trying to walk or exercise for 3 days a week for at least 30 mins a day.        Fall Risk: Fall Risk  03/01/2019 02/24/2018 02/23/2017 09/24/2015  Falls in the past year? 0 0 No No  Number falls in past yr: 0 - - -  Injury with Fall? 0 - - -    FALL RISK PREVENTION PERTAINING TO THE HOME:  Any stairs in or around the home? Yes  If so, are there any without handrails? No   Home free of loose throw rugs in walkways, pet beds, electrical cords, etc? Yes  Adequate lighting in your home to reduce risk of falls? Yes   ASSISTIVE DEVICES UTILIZED TO PREVENT FALLS:  Life alert? No  Use of a cane, walker or w/c? No  Grab bars in the  bathroom? No  Shower chair or bench in shower? No  Elevated toilet seat or a handicapped toilet? No    TIMED UP AND GO:  Was the test performed? No .    Depression Screen PHQ 2/9 Scores 03/01/2019 02/24/2018 02/23/2017 02/23/2017  PHQ - 2 Score 0 0 0 0  PHQ- 9 Score - - 0 -     Cognitive Function: Declined today.  Immunization History  Administered Date(s) Administered  . Pneumococcal Conjugate-13 09/24/2015  . Pneumococcal Polysaccharide-23 02/03/2013  . Td 06/24/2010  . Tdap 06/24/2010    Qualifies for Shingles Vaccine? Yes . Due for Shingrix. Education has been provided regarding the importance of this vaccine. Pt has been advised to call insurance company to determine out of pocket expense. Advised may also receive vaccine at local pharmacy or Health Dept. Verbalized acceptance and understanding.  Tdap: Up to date  Flu Vaccine: Due for Flu vaccine. Does the patient want to receive this vaccine today?  No . Advised may receive this vaccine at local pharmacy or Health Dept. Aware to provide a copy of the vaccination record if obtained from local pharmacy or Health Dept. Verbalized acceptance and understanding.  Pneumococcal Vaccine: Completed series  Screening Tests Health Maintenance  Topic Date Due  . INFLUENZA VACCINE  07/19/2019 (Originally 11/19/2018)  . MAMMOGRAM  04/28/2020  . TETANUS/TDAP  06/23/2020  . COLONOSCOPY  12/16/2025  . DEXA SCAN  Completed  . Hepatitis C Screening  Completed  . PNA vac Low Risk Adult  Completed    Cancer Screenings:  Colorectal Screening: Completed 12/17/15. Repeat every 10 years.  Mammogram: Completed 04/28/18.   Bone Density: Completed 10/23/15. Results reflect NORMAL. No repeat needed unless advised by a physician.   Lung Cancer Screening: (Low Dose CT Chest recommended if Age 70-80 years, 30 pack-year currently smoking OR have quit w/in 15years.) does not qualify.   Additional Screening:  Hepatitis C Screening: Up to  date  Vision Screening: Recommended annual ophthalmology exams for early detection of glaucoma and other disorders of the eye.  Dental Screening: Recommended annual dental exams for proper oral hygiene  Community Resource Referral:  CRR required this visit?  No       Plan:  I have personally reviewed and addressed the Medicare Annual Wellness questionnaire and have noted the following in the patient's chart:  A. Medical and social history B. Use of alcohol, tobacco or illicit drugs  C. Current medications and supplements D. Functional ability and status E.  Nutritional status F.  Physical activity G. Advance directives H. List of other physicians I.  Hospitalizations, surgeries, and ER visits in previous 12 months J.  Vitals K. Screenings such as hearing and vision if needed, cognitive and depression L. Referrals and appointments   In addition, I have reviewed and discussed with patient certain preventive protocols, quality metrics, and best practice recommendations. A written personalized care plan for preventive services as well as general preventive health recommendations were provided to patient. Nurse Health Advisor  Signed,    Kolson Chovanec [place], [place]  [id number]/02/2019 Nurse Health Advisor   Nurse Notes: Declined the flu shot today.

## 2019-03-01 ENCOUNTER — Ambulatory Visit (INDEPENDENT_AMBULATORY_CARE_PROVIDER_SITE_OTHER): Payer: Medicare Other

## 2019-03-01 ENCOUNTER — Encounter: Payer: Self-pay | Admitting: Family Medicine

## 2019-03-01 ENCOUNTER — Ambulatory Visit (INDEPENDENT_AMBULATORY_CARE_PROVIDER_SITE_OTHER): Payer: Medicare Other | Admitting: Family Medicine

## 2019-03-01 ENCOUNTER — Other Ambulatory Visit: Payer: Self-pay

## 2019-03-01 VITALS — BP 132/82 | HR 65 | Temp 98.4°F | Ht 65.0 in | Wt 194.0 lb

## 2019-03-01 VITALS — BP 132/82 | HR 65 | Temp 98.4°F | Ht 65.0 in | Wt 194.6 lb

## 2019-03-01 DIAGNOSIS — M25511 Pain in right shoulder: Secondary | ICD-10-CM

## 2019-03-01 DIAGNOSIS — M150 Primary generalized (osteo)arthritis: Secondary | ICD-10-CM

## 2019-03-01 DIAGNOSIS — M7061 Trochanteric bursitis, right hip: Secondary | ICD-10-CM | POA: Diagnosis not present

## 2019-03-01 DIAGNOSIS — Z0000 Encounter for general adult medical examination without abnormal findings: Secondary | ICD-10-CM | POA: Diagnosis not present

## 2019-03-01 DIAGNOSIS — M159 Polyosteoarthritis, unspecified: Secondary | ICD-10-CM

## 2019-03-01 DIAGNOSIS — M8949 Other hypertrophic osteoarthropathy, multiple sites: Secondary | ICD-10-CM | POA: Diagnosis not present

## 2019-03-01 DIAGNOSIS — E7800 Pure hypercholesterolemia, unspecified: Secondary | ICD-10-CM

## 2019-03-01 MED ORDER — PREDNISONE 20 MG PO TABS: ORAL_TABLET | ORAL | 0 refills | Status: DC

## 2019-03-01 NOTE — Assessment & Plan Note (Signed)
New problem Discussed that rest, ice, home exercises can help with this Home exercise program given No imaging at this time Discussed possibility of corticosteroid injection in the future if needed Trial of prednisone burst and taper today Return precautions discussed

## 2019-03-01 NOTE — Patient Instructions (Signed)
Preventive Care 38 Years and Older, Female Preventive care refers to lifestyle choices and visits with your health care provider that can promote health and wellness. This includes:  A yearly physical exam. This is also called an annual well check.  Regular dental and eye exams.  Immunizations.  Screening for certain conditions.  Healthy lifestyle choices, such as diet and exercise. What can I expect for my preventive care visit? Physical exam Your health care provider will check:  Height and weight. These may be used to calculate body mass index (BMI), which is a measurement that tells if you are at a healthy weight.  Heart rate and blood pressure.  Your skin for abnormal spots. Counseling Your health care provider may ask you questions about:  Alcohol, tobacco, and drug use.  Emotional well-being.  Home and relationship well-being.  Sexual activity.  Eating habits.  History of falls.  Memory and ability to understand (cognition).  Work and work Statistician.  Pregnancy and menstrual history. What immunizations do I need?  Influenza (flu) vaccine  This is recommended every year. Tetanus, diphtheria, and pertussis (Tdap) vaccine  You may need a Td booster every 10 years. Varicella (chickenpox) vaccine  You may need this vaccine if you have not already been vaccinated. Zoster (shingles) vaccine  You may need this after age 33. Pneumococcal conjugate (PCV13) vaccine  One dose is recommended after age 33. Pneumococcal polysaccharide (PPSV23) vaccine  One dose is recommended after age 72. Measles, mumps, and rubella (MMR) vaccine  You may need at least one dose of MMR if you were born in 1957 or later. You may also need a second dose. Meningococcal conjugate (MenACWY) vaccine  You may need this if you have certain conditions. Hepatitis A vaccine  You may need this if you have certain conditions or if you travel or work in places where you may be exposed  to hepatitis A. Hepatitis B vaccine  You may need this if you have certain conditions or if you travel or work in places where you may be exposed to hepatitis B. Haemophilus influenzae type b (Hib) vaccine  You may need this if you have certain conditions. You may receive vaccines as individual doses or as more than one vaccine together in one shot (combination vaccines). Talk with your health care provider about the risks and benefits of combination vaccines. What tests do I need? Blood tests  Lipid and cholesterol levels. These may be checked every 5 years, or more frequently depending on your overall health.  Hepatitis C test.  Hepatitis B test. Screening  Lung cancer screening. You may have this screening every year starting at age 39 if you have a 30-pack-year history of smoking and currently smoke or have quit within the past 15 years.  Colorectal cancer screening. All adults should have this screening starting at age 36 and continuing until age 15. Your health care provider may recommend screening at age 23 if you are at increased risk. You will have tests every 1-10 years, depending on your results and the type of screening test.  Diabetes screening. This is done by checking your blood sugar (glucose) after you have not eaten for a while (fasting). You may have this done every 1-3 years.  Mammogram. This may be done every 1-2 years. Talk with your health care provider about how often you should have regular mammograms.  BRCA-related cancer screening. This may be done if you have a family history of breast, ovarian, tubal, or peritoneal cancers.  Other tests  Sexually transmitted disease (STD) testing.  Bone density scan. This is done to screen for osteoporosis. You may have this done starting at age 76. Follow these instructions at home: Eating and drinking  Eat a diet that includes fresh fruits and vegetables, whole grains, lean protein, and low-fat dairy products. Limit  your intake of foods with high amounts of sugar, saturated fats, and salt.  Take vitamin and mineral supplements as recommended by your health care provider.  Do not drink alcohol if your health care provider tells you not to drink.  If you drink alcohol: ? Limit how much you have to 0-1 drink a day. ? Be aware of how much alcohol is in your drink. In the [place] one drink equals one 12 oz bottle of beer (355 mL), one 5 oz glass of wine (148 mL), or one 1 oz glass of hard liquor (44 mL). Lifestyle  Take daily care of your teeth and gums.  Stay active. Exercise for at least 30 minutes on 5 or more days each week.  Do not use any products that contain nicotine or tobacco, such as cigarettes, e-cigarettes, and chewing tobacco. If you need help quitting, ask your health care provider.  If you are sexually active, practice safe sex. Use a condom or other form of protection in order to prevent STIs (sexually transmitted infections).  Talk with your health care provider about taking a low-dose aspirin or statin. What's next?  Go to your health care provider once a year for a well check visit.  Ask your health care provider how often you should have your eyes and teeth checked.  Stay up to date on all vaccines. This information is not intended to replace advice given to you by your health care provider. Make sure you discuss any questions you have with your health care provider. Document Released: 05/03/2015 Document Revised: 03/31/2018 Document Reviewed: 03/31/2018 Elsevier Patient Education  2020 [employer].

## 2019-03-01 NOTE — Assessment & Plan Note (Signed)
Again discussed lifestyle interventions Patient has declined statin previously We will recheck fasting lipid panel and CMP and recalculate ASCVD risk

## 2019-03-01 NOTE — Patient Instructions (Signed)
Melissa Barry , Thank you for taking time to come for your Medicare Wellness Visit. I appreciate your ongoing commitment to your health goals. Please review the following plan we discussed and let me know if I can assist you in the future.   Screening recommendations/referrals: Colonoscopy: Up to date, due 11/2025 Mammogram: Up to date, due 04/2020 Bone Density: Up to date, previous DEXA was normal. No repeat needed unless advised by a physician.  Recommended yearly ophthalmology/optometry visit for glaucoma screening and checkup Recommended yearly dental visit for hygiene and checkup  Vaccinations: Influenza vaccine: Declined today.  Pneumococcal vaccine: Completed series Tdap vaccine: Up to date, due 06/2020 Shingles vaccine: Pt declines today.     Advanced directives: Advance directive discussed with you today. Even though you declined this today please call our office should you change your mind and we can give you the proper paperwork for you to fill out.  Conditions/risks identified: Continue to work on exercising 3 days a week for at least 30 minutes at a time.   Next appointment: 10:20 AM with Dr Brita Romp.    Preventive Care 25 Years and Older, Female Preventive care refers to lifestyle choices and visits with your health care provider that can promote health and wellness. What does preventive care include?  A yearly physical exam. This is also called an annual well check.  Dental exams once or twice a year.  Routine eye exams. Ask your health care provider how often you should have your eyes checked.  Personal lifestyle choices, including:  Daily care of your teeth and gums.  Regular physical activity.  Eating a healthy diet.  Avoiding tobacco and drug use.  Limiting alcohol use.  Practicing safe sex.  Taking low-dose aspirin every day.  Taking vitamin and mineral supplements as recommended by your health care provider. What happens during an annual well check?  The services and screenings done by your health care provider during your annual well check will depend on your age, overall health, lifestyle risk factors, and family history of disease. Counseling  Your health care provider may ask you questions about your:  Alcohol use.  Tobacco use.  Drug use.  Emotional well-being.  Home and relationship well-being.  Sexual activity.  Eating habits.  History of falls.  Memory and ability to understand (cognition).  Work and work Statistician.  Reproductive health. Screening  You may have the following tests or measurements:  Height, weight, and BMI.  Blood pressure.  Lipid and cholesterol levels. These may be checked every 5 years, or more frequently if you are over 78 years old.  Skin check.  Lung cancer screening. You may have this screening every year starting at age 46 if you have a 30-pack-year history of smoking and currently smoke or have quit within the past 15 years.  Fecal occult blood test (FOBT) of the stool. You may have this test every year starting at age 37.  Flexible sigmoidoscopy or colonoscopy. You may have a sigmoidoscopy every 5 years or a colonoscopy every 10 years starting at age 74.  Hepatitis C blood test.  Hepatitis B blood test.  Sexually transmitted disease (STD) testing.  Diabetes screening. This is done by checking your blood sugar (glucose) after you have not eaten for a while (fasting). You may have this done every 1-3 years.  Bone density scan. This is done to screen for osteoporosis. You may have this done starting at age 50.  Mammogram. This may be done every 1-2 years. Talk  to your health care provider about how often you should have regular mammograms. Talk with your health care provider about your test results, treatment options, and if necessary, the need for more tests. Vaccines  Your health care provider may recommend certain vaccines, such as:  Influenza vaccine. This is  recommended every year.  Tetanus, diphtheria, and acellular pertussis (Tdap, Td) vaccine. You may need a Td booster every 10 years.  Zoster vaccine. You may need this after age 64.  Pneumococcal 13-valent conjugate (PCV13) vaccine. One dose is recommended after age 43.  Pneumococcal polysaccharide (PPSV23) vaccine. One dose is recommended after age 25. Talk to your health care provider about which screenings and vaccines you need and how often you need them. This information is not intended to replace advice given to you by your health care provider. Make sure you discuss any questions you have with your health care provider. Document Released: 05/03/2015 Document Revised: 12/25/2015 Document Reviewed: 02/05/2015 Elsevier Interactive Patient Education  2017 [employer] Prevention in the Home Falls can cause injuries. They can happen to people of all ages. There are many things you can do to make your home safe and to help prevent falls. What can I do on the outside of my home?  Regularly fix the edges of walkways and driveways and fix any cracks.  Remove anything that might make you trip as you walk through a door, such as a raised step or threshold.  Trim any bushes or trees on the path to your home.  Use bright outdoor lighting.  Clear any walking paths of anything that might make someone trip, such as rocks or tools.  Regularly check to see if handrails are loose or broken. Make sure that both sides of any steps have handrails.  Any raised decks and porches should have guardrails on the edges.  Have any leaves, snow, or ice cleared regularly.  Use sand or salt on walking paths during winter.  Clean up any spills in your garage right away. This includes oil or grease spills. What can I do in the bathroom?  Use night lights.  Install grab bars by the toilet and in the tub and shower. Do not use towel bars as grab bars.  Use non-skid mats or decals in the tub or  shower.  If you need to sit down in the shower, use a plastic, non-slip stool.  Keep the floor dry. Clean up any water that spills on the floor as soon as it happens.  Remove soap buildup in the tub or shower regularly.  Attach bath mats securely with double-sided non-slip rug tape.  Do not have throw rugs and other things on the floor that can make you trip. What can I do in the bedroom?  Use night lights.  Make sure that you have a light by your bed that is easy to reach.  Do not use any sheets or blankets that are too big for your bed. They should not hang down onto the floor.  Have a firm chair that has side arms. You can use this for support while you get dressed.  Do not have throw rugs and other things on the floor that can make you trip. What can I do in the kitchen?  Clean up any spills right away.  Avoid walking on wet floors.  Keep items that you use a lot in easy-to-reach places.  If you need to reach something above you, use a strong step stool that  has a grab bar.  Keep electrical cords out of the way.  Do not use floor polish or wax that makes floors slippery. If you must use wax, use non-skid floor wax.  Do not have throw rugs and other things on the floor that can make you trip. What can I do with my stairs?  Do not leave any items on the stairs.  Make sure that there are handrails on both sides of the stairs and use them. Fix handrails that are broken or loose. Make sure that handrails are as long as the stairways.  Check any carpeting to make sure that it is firmly attached to the stairs. Fix any carpet that is loose or worn.  Avoid having throw rugs at the top or bottom of the stairs. If you do have throw rugs, attach them to the floor with carpet tape.  Make sure that you have a light switch at the top of the stairs and the bottom of the stairs. If you do not have them, ask someone to add them for you. What else can I do to help prevent falls?   Wear shoes that:  Do not have high heels.  Have rubber bottoms.  Are comfortable and fit you well.  Are closed at the toe. Do not wear sandals.  If you use a stepladder:  Make sure that it is fully opened. Do not climb a closed stepladder.  Make sure that both sides of the stepladder are locked into place.  Ask someone to hold it for you, if possible.  Clearly mark and make sure that you can see:  Any grab bars or handrails.  First and last steps.  Where the edge of each step is.  Use tools that help you move around (mobility aids) if they are needed. These include:  Canes.  Walkers.  Scooters.  Crutches.  Turn on the lights when you go into a dark area. Replace any light bulbs as soon as they burn out.  Set up your furniture so you have a clear path. Avoid moving your furniture around.  If any of your floors are uneven, fix them.  If there are any pets around you, be aware of where they are.  Review your medicines with your doctor. Some medicines can make you feel dizzy. This can increase your chance of falling. Ask your doctor what other things that you can do to help prevent falls. This information is not intended to replace advice given to you by your health care provider. Make sure you discuss any questions you have with your health care provider. Document Released: 01/31/2009 Document Revised: 09/12/2015 Document Reviewed: 05/11/2014 Elsevier Interactive Patient Education  2017 [employer].

## 2019-03-01 NOTE — Assessment & Plan Note (Signed)
Patient with pain in multiple joints, likely related to osteoarthritis Discussed intermittent NSAIDs Discussed home exercise programs Discussed icing and staying active She has previously described her pain as burning, but she denies other descriptions of neuropathy and says maybe it is more of an ache We will not try gabapentin at this time, but could consider in the future Given her greater trochanteric bursitis and multiple joints with pain and swelling currently, we will try a burst and taper of prednisone Discussed option to see an orthopedist, but patient declines at this time Can consider imaging in the future if no improvement with prednisone

## 2019-03-01 NOTE — Progress Notes (Signed)
Patient: Melissa Barry, Female    DOB: 30-Jun-1945, 73 y.o.   MRN: [id number] Visit Date: 03/01/2019  Today's Provider: Shirlee Latch, MD   Chief Complaint  Patient presents with  . Annual Exam   Subjective:     Complete Physical Melissa Barry is a 73 y.o. female. She feels fairly well. She reports not exercising. She reports she is sleeping fairly well.  Has trouble sleeping occasionally.  ----------------------------------------------------------- Pain in R hip with radiation to knee. Worse with sleeping on that side at nighttime.   Ongoing intermittently for ~6 months.  Thinks it started after climbing up into high vehicle.  Continues to have bilateral foot/ankle pain.  Having some burning pain, but denies that this is similar to pins and needles or tingling.  R shoulder pain intermittently. Describes as a throbbing pain.  Ongoing intermittently for 6 months.  Not sure what makes it worse or better  Review of Systems  Constitutional: Negative.   HENT: Negative.   Eyes: Negative.   Respiratory: Negative.   Cardiovascular: Negative.   Gastrointestinal: Negative.   Endocrine: Negative.   Genitourinary: Negative.   Musculoskeletal: Negative.   Skin: Negative.   Allergic/Immunologic: Negative.   Neurological: Negative.   Hematological: Negative.   Psychiatric/Behavioral: Negative.     Social History   Socioeconomic History  . Marital status: Married    Spouse name: Not on file  . Number of children: 2  . Years of education: Not on file  . Highest education level: Some college, no degree  Occupational History  . Not on file  Social Needs  . Financial resource strain: Not hard at all  . Food insecurity    Worry: Never true    Inability: Never true  . Transportation needs    Medical: No    Non-medical: No  Tobacco Use  . Smoking status: Former Smoker    Years: 2.00    Types: Cigarettes  . Smokeless tobacco: Never Used  . Tobacco comment: only  smoked in college  Substance and Sexual Activity  . Alcohol use: Yes    Alcohol/week: 1.0 - 2.0 standard drinks    Types: 1 - 2 Cans of beer per week  . Drug use: No  . Sexual activity: Not on file  Lifestyle  . Physical activity    Days per week: Not on file    Minutes per session: Not on file  . Stress: Not at all  Relationships  . Social Musician on phone: Patient refused    Gets together: Patient refused    Attends religious service: Patient refused    Active member of club or organization: Patient refused    Attends meetings of clubs or organizations: Patient refused    Relationship status: Patient refused  . Intimate partner violence    Fear of current or ex partner: Patient refused    Emotionally abused: Patient refused    Physically abused: Patient refused    Forced sexual activity: Patient refused  Other Topics Concern  . Not on file  Social History Narrative  . Not on file    Past Medical History:  Diagnosis Date  . Medical history non-contributory      Patient Active Problem List   Diagnosis Date Noted  . Peripheral neuropathy 02/24/2018  . Achilles tendinitis of both lower extremities 02/24/2018  . Tachycardia, paroxysmal (HCC) 10/15/2017  . Elevated BP without diagnosis of hypertension 02/24/2017  . Special  screening for malignant neoplasms, colon   . Constipation 06/25/2015  . Hypercholesterolemia 06/13/2015  . 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 Arthritis, degenerative 06/13/2015  . 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 BREAST BIOPSY Left years ago   Dr. Lemar Livings   . COLONOSCOPY WITH PROPOFOL N/A 12/17/2015   Procedure: COLONOSCOPY WITH PROPOFOL;  Surgeon: Midge Minium, MD;  Location: [facility] ENDOSCOPY;  Service: Endoscopy;  Laterality: N/A;  . DILATION AND CURETTAGE OF UTERUS    . TONSILLECTOMY AND ADENOIDECTOMY      Her family history includes Celiac disease in her sister; Congestive Heart Failure  in her father; Heart disease in her father; Hepatitis in her sister; Lung cancer in her father and paternal grandfather; Migraines in her daughter and son; Ovarian cancer in her mother; Parkinson's disease in her maternal grandfather; Sarcoidosis in her son; Stroke in her paternal grandmother; Thyroid disease in her father.  No current outpatient medications on file.  Patient Care Team: Erasmo Downer, MD as PCP - General (Family Medicine) Juanell Fairly, MD as Referring Physician (Orthopedic Surgery) Galen Manila, MD as Referring Physician (Ophthalmology)     Objective:    Vitals: There were no vitals taken for this visit.  Physical Exam Vitals signs reviewed.  Constitutional:      General: She is not in acute distress.    Appearance: Normal appearance. She is well-developed. She is not diaphoretic.  HENT:     Head: Normocephalic and atraumatic.     Right Ear: Tympanic membrane, ear canal and external ear normal.     Left Ear: Tympanic membrane, ear canal and external ear normal.  Eyes:     General: No scleral icterus.    Conjunctiva/sclera: Conjunctivae normal.     Pupils: Pupils are equal, round, and reactive to light.  Neck:     Musculoskeletal: Neck supple.     Thyroid: No thyromegaly.  Cardiovascular:     Rate and Rhythm: Normal rate and regular rhythm.     Pulses: Normal pulses.     Heart sounds: Normal heart sounds. No murmur.  Pulmonary:     Effort: Pulmonary effort is normal. No respiratory distress.     Breath sounds: Normal breath sounds. No wheezing or rales.  Abdominal:     General: There is no distension.     Palpations: Abdomen is soft.     Tenderness: There is no abdominal tenderness.  Musculoskeletal:     Right lower leg: No edema.     Left lower leg: No edema.     Comments: Hip ROM intact.  Strength intact in bilateral lower extremities.  Some tenderness to palpation over right greater trochanter. Right shoulder with normal range of motion,  normal rotator cuff strength, no deformity, no tenderness to palpation along bony landmarks.  Negative Hawkins and empty can testing Bilateral ankles with some swelling, but no tenderness palpation along bony landmarks.  Range of motion is intact  Lymphadenopathy:     Cervical: No cervical adenopathy.  Skin:    General: Skin is warm and dry.     Capillary Refill: Capillary refill takes less than 2 seconds.     Findings: No rash.  Neurological:     Mental Status: She is alert and oriented to person, place, and time. Mental status is at baseline.     Sensory: No sensory deficit.     Motor: No weakness.  Psychiatric:        Mood and Affect:  Mood normal.        Behavior: Behavior normal.        Thought Content: Thought content normal.     Activities of Daily Living No flowsheet data found.  Fall Risk Assessment Fall Risk  02/24/2018 02/23/2017 09/24/2015  Falls in the past year? 0 No No     Depression Screen PHQ 2/9 Scores 02/24/2018 02/23/2017 02/23/2017 09/24/2015  PHQ - 2 Score 0 0 0 0  PHQ- 9 Score - 0 - -    No flowsheet data found.     Assessment & Plan:    Annual Physical Reviewed patient's Family Medical History Reviewed and updated list of patient's medical providers Assessment of cognitive impairment was done Assessed patient's functional ability Established a written schedule for health screening services Health Risk Assessent Completed and Reviewed  Exercise Activities and Dietary recommendations Goals    . Exercise 3x per week (30 min per time)     Recommend doing some form of exercise for 3 days a week for at least 30 minutes.   02/24/18, Continue trying to walk or exercise for 3 days a week for at least 30 mins a day.        Immunization History  Administered Date(s) Administered  . Pneumococcal Conjugate-13 09/24/2015  . Pneumococcal Polysaccharide-23 02/03/2013  . Td 06/24/2010  . Tdap 06/24/2010    Health Maintenance  Topic Date Due  . INFLUENZA  VACCINE  11/19/2018  . MAMMOGRAM  04/28/2020  . TETANUS/TDAP  06/23/2020  . COLONOSCOPY  12/16/2025  . DEXA SCAN  Completed  . Hepatitis C Screening  Completed  . PNA vac Low Risk Adult  Completed     Discussed health benefits of physical activity, and encouraged her to engage in regular exercise appropriate for her age and condition.    ------------------------------------------------------------------------------------------------------------  Problem List Items Addressed This Visit      Musculoskeletal and Integument   Arthritis, degenerative    Patient with pain in multiple joints, likely related to osteoarthritis Discussed intermittent NSAIDs Discussed home exercise programs Discussed icing and staying active She has previously described her pain as burning, but she denies other descriptions of neuropathy and says maybe it is more of an ache We will not try gabapentin at this time, but could consider in the future Given her greater trochanteric bursitis and multiple joints with pain and swelling currently, we will try a burst and taper of prednisone Discussed option to see an orthopedist, but patient declines at this time Can consider imaging in the future if no improvement with prednisone      Relevant Medications   predniSONE (DELTASONE) 20 MG tablet   Trochanteric bursitis of right hip    New problem Discussed that rest, ice, home exercises can help with this Home exercise program given No imaging at this time Discussed possibility of corticosteroid injection in the future if needed Trial of prednisone burst and taper today Return precautions discussed        Other   Hypercholesterolemia    Again discussed lifestyle interventions Patient has declined statin previously We will recheck fasting lipid panel and CMP and recalculate ASCVD risk      Relevant Orders   CMP (Comprehensive metabolic panel)   Lipid panel   Acute pain of right shoulder    As above,  suspect this is related to osteoarthritis Prednisone as above Discussed icing and home exercise program Return precautions discussed       Other Visit Diagnoses  Encounter for annual physical exam    -  Primary   Relevant Orders   CMP (Comprehensive metabolic panel)   Lipid panel       Return in about 1 year (around 02/29/2020) for CPE/AWV, as scheduled.   The entirety of the information documented in the History of Present Illness, Review of Systems and Physical Exam were personally obtained by me. Portions of this information were initially documented by Ashley Royalty, CMA and reviewed by me for thoroughness and accuracy.    Dnaiel Voller, Dionne Bucy, MD MPH [facility]

## 2019-03-01 NOTE — Assessment & Plan Note (Signed)
As above, suspect this is related to osteoarthritis Prednisone as above Discussed icing and home exercise program Return precautions discussed

## 2019-03-02 ENCOUNTER — Telehealth: Payer: Self-pay

## 2019-03-02 LAB — LIPID PANEL
Chol/HDL Ratio: 3.7 ratio (ref 0.0–4.4)
Cholesterol, Total: 266 mg/dL — ABNORMAL HIGH (ref 100–199)
HDL: 71 mg/dL (ref >39)
LDL Chol Calc (NIH): 173 mg/dL — ABNORMAL HIGH (ref 0–99)
Triglycerides: 124 mg/dL (ref 0–149)
VLDL Cholesterol Cal: 22 mg/dL (ref 5–40)

## 2019-03-02 LAB — COMPREHENSIVE METABOLIC PANEL
ALT: 18 IU/L (ref 0–32)
AST: 22 IU/L (ref 0–40)
Albumin/Globulin Ratio: 2.2 (ref 1.2–2.2)
Albumin: 4.4 g/dL (ref 3.7–4.7)
Alkaline Phosphatase: 76 IU/L (ref 39–117)
BUN/Creatinine Ratio: 22 (ref 12–28)
BUN: 17 mg/dL (ref 8–27)
Bilirubin Total: 0.7 mg/dL (ref 0.0–1.2)
CO2: 21 mmol/L (ref 20–29)
Calcium: 9.4 mg/dL (ref 8.7–10.3)
Chloride: 103 mmol/L (ref 96–106)
Creatinine, Ser: 0.78 mg/dL (ref 0.57–1.00)
GFR calc Af Amer: 88 mL/min/{1.73_m2} (ref >59)
GFR calc non Af Amer: 77 mL/min/{1.73_m2} (ref >59)
Globulin, Total: 2 g/dL (ref 1.5–4.5)
Glucose: 87 mg/dL (ref 65–99)
Potassium: 4.5 mmol/L (ref 3.5–5.2)
Sodium: 139 mmol/L (ref 134–144)
Total Protein: 6.4 g/dL (ref 6.0–8.5)

## 2019-03-02 NOTE — Telephone Encounter (Signed)
Pt advised.  She does not want to start a cholesterol medication at this time.  She will continue to work on lifestyle changes.   Thanks,   -Mickel Baas

## 2019-03-02 NOTE — Telephone Encounter (Signed)
-----   Message from Virginia Crews, MD sent at 03/02/2019  8:48 AM EST ----- Normal labs, except cholesterol remains elevated.  It is grossly unchanged over the last year.  HDL, which is good cholesterol, is high, which is protective.  10-year risk of heart disease and stroke does remain elevated at 9.7%, however.  This number suggest that patient does need a statin.  I know that patient and I have previously discussed statins and she has been wary of them.  We did discuss the risks and benefits of statins at our last visit.  If she would like to go ahead and start a statin, we can prescribe atorvastatin

## 2019-03-06 ENCOUNTER — Telehealth: Payer: Self-pay

## 2019-03-06 NOTE — Telephone Encounter (Signed)
Good to hear

## 2019-03-06 NOTE — Telephone Encounter (Signed)
Copied from [place] [id number]. Topic: General - Inquiry >> Mar 06, 2019 11:06 AM Scherrie Gerlach wrote: Reason for CRM: Pt wants Dr B to know the prednisone did the trick and she is much better!! After the first day she had improvement.  Would like to thank you.

## 2019-06-03 ENCOUNTER — Ambulatory Visit: Payer: Medicare PPO | Attending: Internal Medicine

## 2019-06-03 DIAGNOSIS — Z23 Encounter for immunization: Secondary | ICD-10-CM

## 2019-06-03 NOTE — Progress Notes (Signed)
   Covid-19 Vaccination Clinic  Name:  Melissa Barry    MRN: [id number] DOB: [birth year]  06/03/2019  Melissa Barry was observed post Covid-19 immunization for 15 minutes without incidence. She was provided with Vaccine Information Sheet and instruction to access the V-Safe system.   Melissa Barry was instructed to call 911 with any severe reactions post vaccine: Marland Kitchen Difficulty breathing  . Swelling of your face and throat  . A fast heartbeat  . A bad rash all over your body  . Dizziness and weakness    Immunizations Administered    Name Date Dose VIS Date Route   Pfizer COVID-19 Vaccine 06/03/2019 11:27 AM 0.3 mL 03/31/2019 Intramuscular   Manufacturer: [employer], [employer]   Lot: [id number]   NDC: [address]-8

## 2019-06-24 ENCOUNTER — Ambulatory Visit: Payer: Medicare PPO | Attending: Internal Medicine

## 2019-06-24 DIAGNOSIS — Z23 Encounter for immunization: Secondary | ICD-10-CM | POA: Insufficient documentation

## 2019-06-24 NOTE — Progress Notes (Signed)
   Covid-19 Vaccination Clinic  Name:  Melissa Barry    MRN: [id number] DOB: Apr 30, 1945  06/24/2019  [name] was observed post Covid-19 immunization for 15 minutes without incident. She was provided with Vaccine Information Sheet and instruction to access the V-Safe system.   [name] was instructed to call 911 with any severe reactions post vaccine: Marland Kitchen Difficulty breathing  . Swelling of face and throat  . A fast heartbeat  . A bad rash all over body  . Dizziness and weakness   Immunizations Administered    Name Date Dose VIS Date Route   Pfizer COVID-19 Vaccine 06/24/2019 11:30 AM 0.3 mL 03/31/2019 Intramuscular   Manufacturer: [employer], [employer]   Lot: [id number]   NDC: [address]-2

## 2019-07-23 IMAGING — CR DG CHEST 2V
2 series · 2 of 2 positions shown · non-contrast
Comparison: None available.

CLINICAL DATA: Initial evaluation for acute chest pain.

EXAM:
CHEST - 2 VIEW

[chest pa]
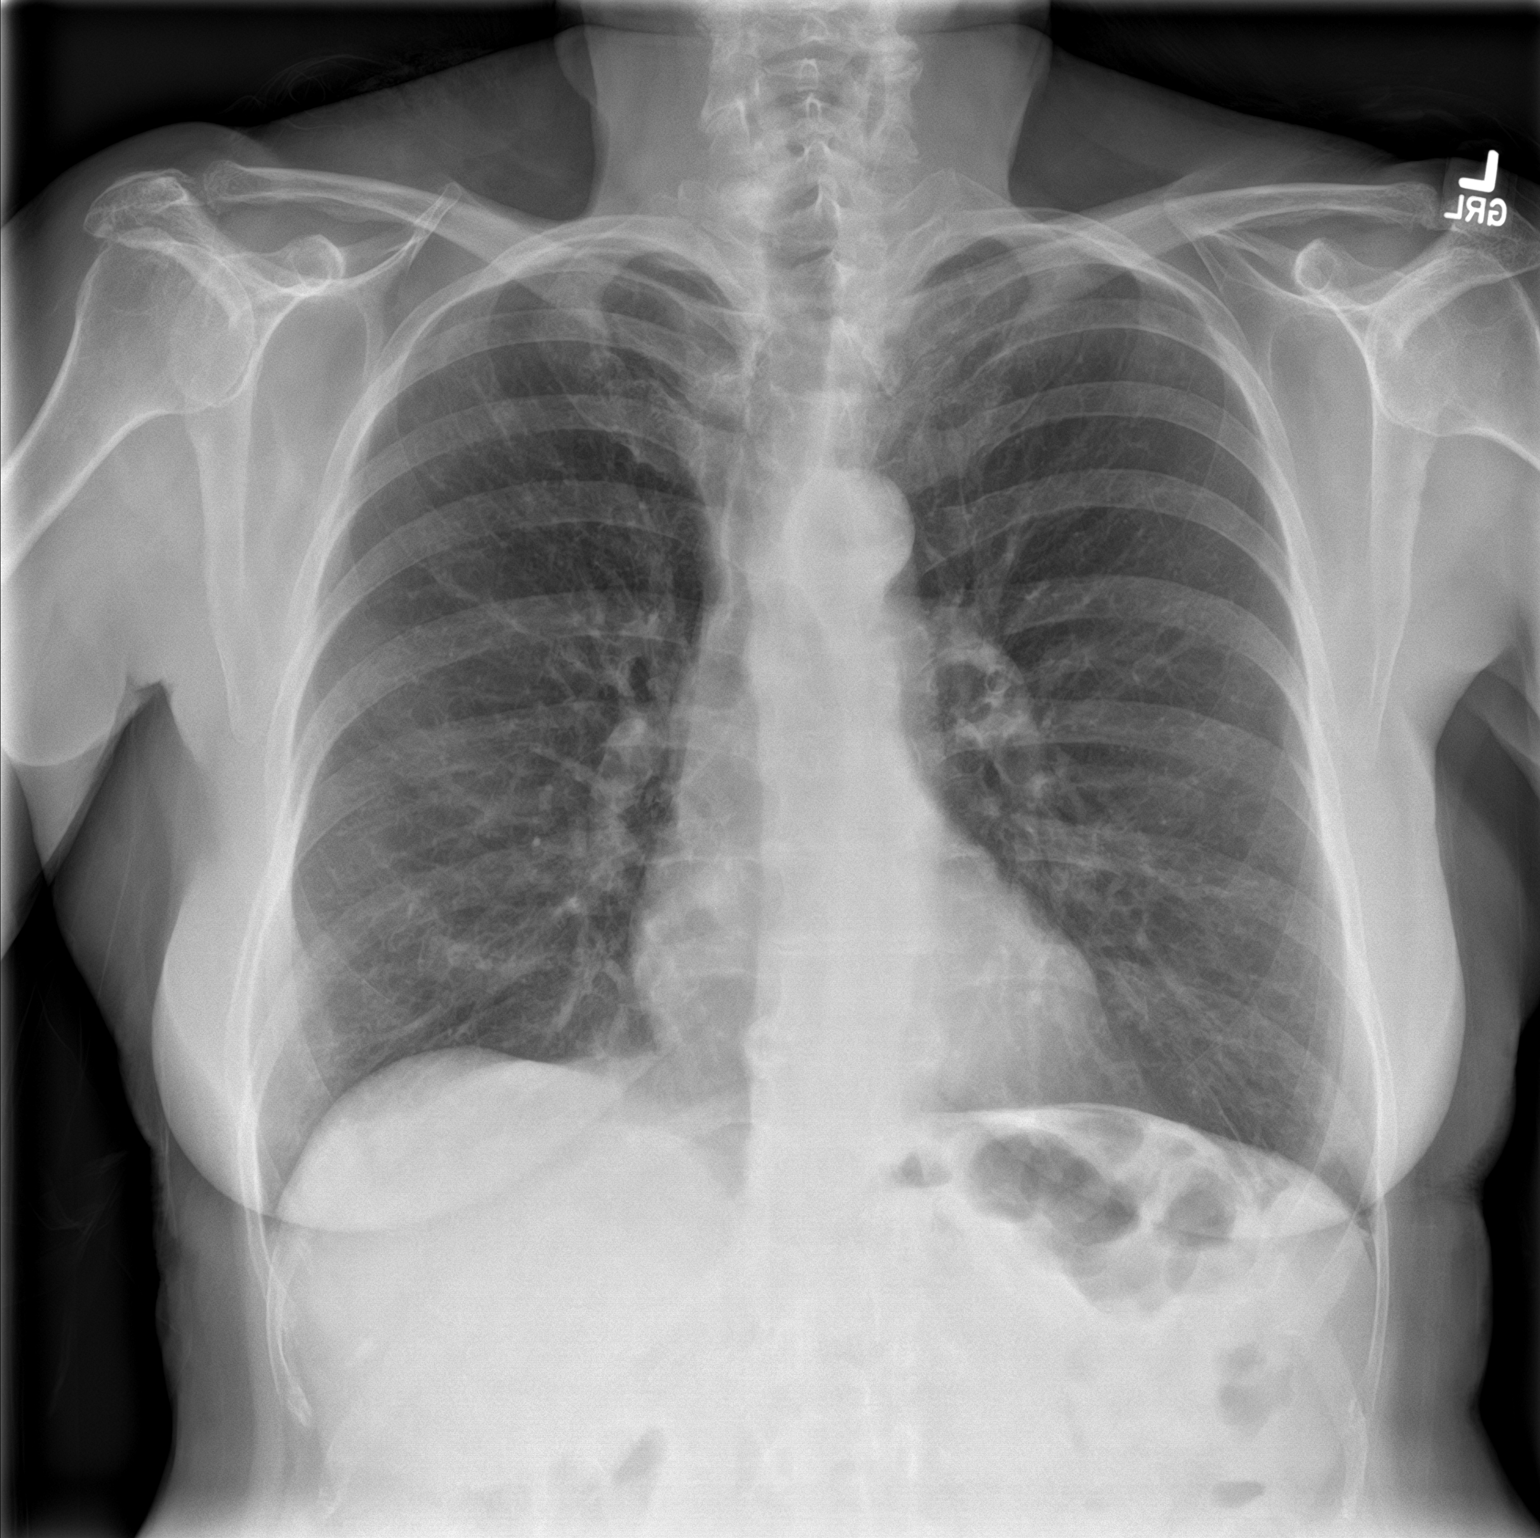

[chest lat]
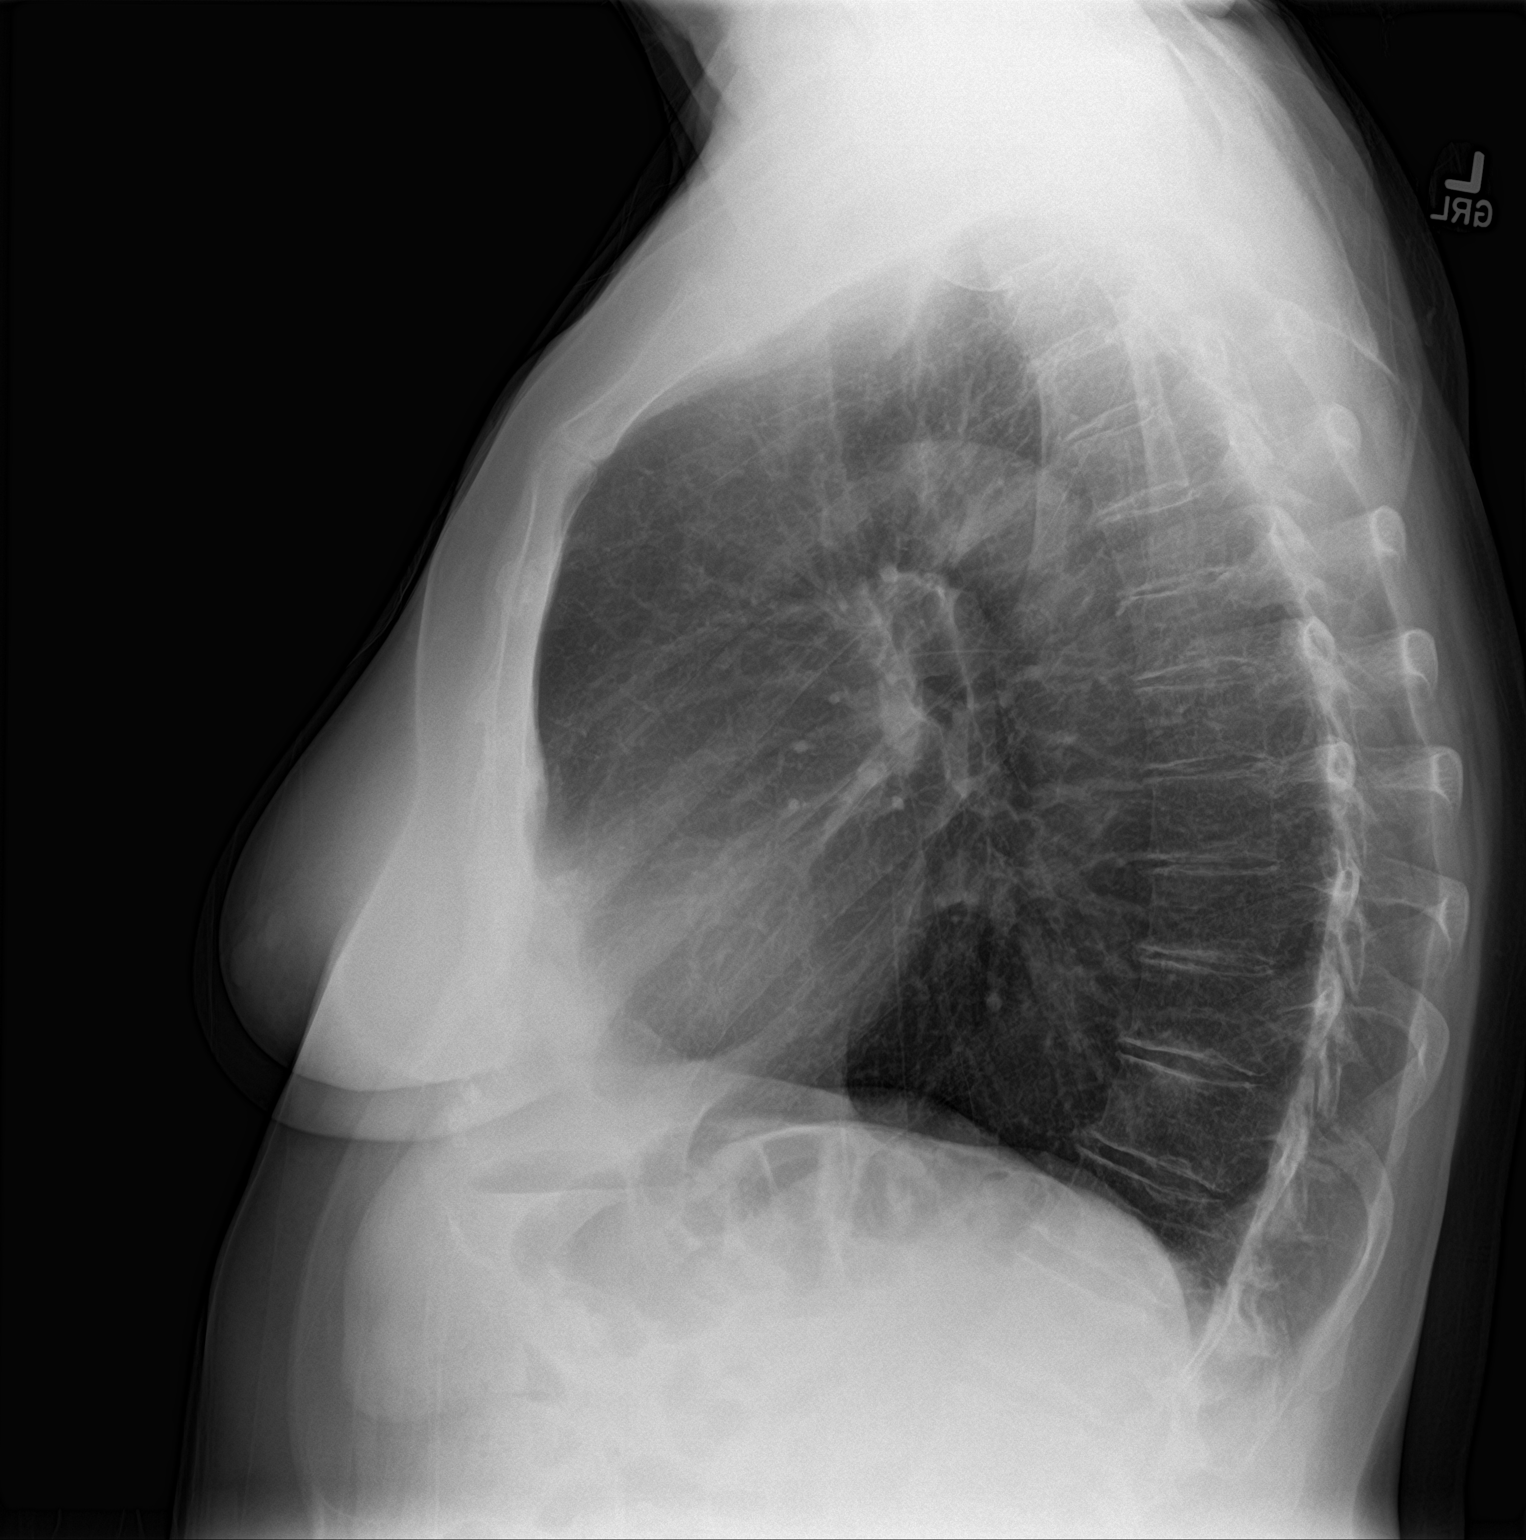

[2 of 2 positions shown; findings below may reference images not displayed]

FINDINGS: The cardiac and mediastinal silhouettes are within normal limits.

The lungs are normally inflated. No airspace consolidation, pleural
effusion, or pulmonary edema is identified. There is no
pneumothorax.

No acute osseous abnormality identified.
IMPRESSION: No active cardiopulmonary disease.

## 2019-09-29 DIAGNOSIS — D225 Melanocytic nevi of trunk: Secondary | ICD-10-CM | POA: Diagnosis not present

## 2019-09-29 DIAGNOSIS — L923 Foreign body granuloma of the skin and subcutaneous tissue: Secondary | ICD-10-CM | POA: Diagnosis not present

## 2019-09-29 DIAGNOSIS — D2261 Melanocytic nevi of right upper limb, including shoulder: Secondary | ICD-10-CM | POA: Diagnosis not present

## 2019-09-29 DIAGNOSIS — D2262 Melanocytic nevi of left upper limb, including shoulder: Secondary | ICD-10-CM | POA: Diagnosis not present

## 2019-09-29 DIAGNOSIS — D2272 Melanocytic nevi of left lower limb, including hip: Secondary | ICD-10-CM | POA: Diagnosis not present

## 2019-09-29 DIAGNOSIS — D2271 Melanocytic nevi of right lower limb, including hip: Secondary | ICD-10-CM | POA: Diagnosis not present

## 2019-11-13 DIAGNOSIS — M19072 Primary osteoarthritis, left ankle and foot: Secondary | ICD-10-CM | POA: Diagnosis not present

## 2019-11-13 DIAGNOSIS — M67879 Other specified disorders of synovium and tendon, unspecified ankle and foot: Secondary | ICD-10-CM | POA: Diagnosis not present

## 2019-11-13 DIAGNOSIS — M67872 Other specified disorders of synovium, left ankle and foot: Secondary | ICD-10-CM | POA: Diagnosis not present

## 2019-11-13 DIAGNOSIS — M19079 Primary osteoarthritis, unspecified ankle and foot: Secondary | ICD-10-CM | POA: Diagnosis not present

## 2019-11-13 DIAGNOSIS — M7061 Trochanteric bursitis, right hip: Secondary | ICD-10-CM | POA: Diagnosis not present

## 2019-12-04 DIAGNOSIS — M19079 Primary osteoarthritis, unspecified ankle and foot: Secondary | ICD-10-CM | POA: Diagnosis not present

## 2019-12-04 DIAGNOSIS — M25571 Pain in right ankle and joints of right foot: Secondary | ICD-10-CM | POA: Diagnosis not present

## 2019-12-04 DIAGNOSIS — R269 Unspecified abnormalities of gait and mobility: Secondary | ICD-10-CM | POA: Diagnosis not present

## 2019-12-04 DIAGNOSIS — M25572 Pain in left ankle and joints of left foot: Secondary | ICD-10-CM | POA: Diagnosis not present

## 2019-12-04 DIAGNOSIS — M67879 Other specified disorders of synovium and tendon, unspecified ankle and foot: Secondary | ICD-10-CM | POA: Diagnosis not present

## 2019-12-04 DIAGNOSIS — M7061 Trochanteric bursitis, right hip: Secondary | ICD-10-CM | POA: Diagnosis not present

## 2019-12-04 DIAGNOSIS — M25551 Pain in right hip: Secondary | ICD-10-CM | POA: Diagnosis not present

## 2019-12-14 DIAGNOSIS — M7061 Trochanteric bursitis, right hip: Secondary | ICD-10-CM | POA: Diagnosis not present

## 2019-12-14 DIAGNOSIS — R269 Unspecified abnormalities of gait and mobility: Secondary | ICD-10-CM | POA: Diagnosis not present

## 2019-12-14 DIAGNOSIS — M25551 Pain in right hip: Secondary | ICD-10-CM | POA: Diagnosis not present

## 2019-12-14 DIAGNOSIS — M67879 Other specified disorders of synovium and tendon, unspecified ankle and foot: Secondary | ICD-10-CM | POA: Diagnosis not present

## 2019-12-14 DIAGNOSIS — M25572 Pain in left ankle and joints of left foot: Secondary | ICD-10-CM | POA: Diagnosis not present

## 2019-12-14 DIAGNOSIS — M19079 Primary osteoarthritis, unspecified ankle and foot: Secondary | ICD-10-CM | POA: Diagnosis not present

## 2019-12-14 DIAGNOSIS — M25571 Pain in right ankle and joints of right foot: Secondary | ICD-10-CM | POA: Diagnosis not present

## 2019-12-20 DIAGNOSIS — M7061 Trochanteric bursitis, right hip: Secondary | ICD-10-CM | POA: Diagnosis not present

## 2019-12-20 DIAGNOSIS — M19079 Primary osteoarthritis, unspecified ankle and foot: Secondary | ICD-10-CM | POA: Diagnosis not present

## 2019-12-20 DIAGNOSIS — M25571 Pain in right ankle and joints of right foot: Secondary | ICD-10-CM | POA: Diagnosis not present

## 2019-12-20 DIAGNOSIS — M67879 Other specified disorders of synovium and tendon, unspecified ankle and foot: Secondary | ICD-10-CM | POA: Diagnosis not present

## 2019-12-20 DIAGNOSIS — M25551 Pain in right hip: Secondary | ICD-10-CM | POA: Diagnosis not present

## 2019-12-20 DIAGNOSIS — M25572 Pain in left ankle and joints of left foot: Secondary | ICD-10-CM | POA: Diagnosis not present

## 2019-12-20 DIAGNOSIS — R269 Unspecified abnormalities of gait and mobility: Secondary | ICD-10-CM | POA: Diagnosis not present

## 2020-02-08 ENCOUNTER — Telehealth: Payer: Self-pay

## 2020-02-08 NOTE — Telephone Encounter (Signed)
Copied from CRM [id number]. Topic: Appointment Scheduling - Scheduling Inquiry for Clinic >> Feb 08, 2020 11:23 AM Melissa Barry wrote: Reason for CRM: patient physical scheduled for 11/17 had to be [facility] due to provider,  patient would like to conduct her AWV on if possible 05/03/2019, please advise

## 2020-02-26 NOTE — Telephone Encounter (Signed)
Scheduled telephonic AWV for 03/07/20 @ 10:20 AM.

## 2020-03-06 ENCOUNTER — Encounter: Payer: Medicare Other | Admitting: Family Medicine

## 2020-03-06 NOTE — Progress Notes (Signed)
Subjective:   Melissa Barry is a 74 y.o. female who presents for Medicare Annual (Subsequent) preventive examination.  I connected with Jearld Adjutant today by telephone and verified that I am speaking with the correct person using two identifiers. Location patient: home Location provider: work Persons participating in the virtual visit: patient, provider.   I discussed the limitations, risks, security and privacy concerns of performing an evaluation and management service by telephone and the availability of in person appointments. I also discussed with the patient that there may be a patient responsible charge related to this service. The patient expressed understanding and verbally consented to this telephonic visit.    Interactive audio and video telecommunications were attempted between this provider and patient, however failed, due to patient having technical difficulties OR patient did not have access to video capability.  We continued and completed visit with audio only.   Review of Systems    N/A  Cardiac Risk Factors include: advanced age (>16men, >78 women)     Objective:    There were no vitals filed for this visit. There is no height or weight on file to calculate BMI.  Advanced Directives 03/07/2020 03/01/2019 02/24/2018 02/23/2017 12/17/2015 09/24/2015  Does Patient Have a Medical Advance Directive? No No No No No Yes  Type of Advance Directive - - - - - Midwife;Living will  Would patient like information on creating a medical advance directive? No - Patient declined No - Patient declined No - Patient declined No - Patient declined - -    Current Medications (verified) Outpatient Encounter Medications as of 03/07/2020  Medication Sig  . ibuprofen (ADVIL) 200 MG tablet Take 200 mg by mouth every 6 (six) hours as needed.  . predniSONE (DELTASONE) 20 MG tablet Take 60mg  PO daily x 2 days, then40mg  PO daily x 2 days, then 20mg  PO daily x 3 days  (Patient not taking: Reported on 03/07/2020)   No facility-administered encounter medications on file as of 03/07/2020.    Allergies (verified) Doxycycline, Penicillins, and Sulfa antibiotics   History: Past Medical History:  Diagnosis Date  . Medical history non-contributory    Past Surgical History:  Procedure Laterality Date  . BREAST BIOPSY Left years ago   Dr. 03/09/2020   . COLONOSCOPY WITH PROPOFOL N/A 12/17/2015   Procedure: COLONOSCOPY WITH PROPOFOL;  Surgeon: Lemar Livings, MD;  Location: [facility] ENDOSCOPY;  Service: Endoscopy;  Laterality: N/A;  . DILATION AND CURETTAGE OF UTERUS    . TONSILLECTOMY AND ADENOIDECTOMY     Family History  Problem Relation Age of Onset  . Ovarian cancer Mother   . Heart disease Father   . Lung cancer Father   . Thyroid disease Father   . Congestive Heart Failure Father   . Hepatitis Sister   . Celiac disease Sister   . Migraines Daughter   . Migraines Son   . Sarcoidosis Son   . Parkinson's disease Maternal Grandfather   . Stroke Paternal Grandmother   . Lung cancer Paternal Grandfather    Social History   Socioeconomic History  . Marital status: Married    Spouse name: Not on file  . Number of children: 2  . Years of education: Not on file  . Highest education level: Some college, no degree  Occupational History  . Occupation: retired  Tobacco Use  . Smoking status: Former Smoker    Years: 2.00    Types: Cigarettes  . Smokeless tobacco: Never Used  . Tobacco  comment: only smoked in college  Vaping Use  . Vaping Use: Never used  Substance and Sexual Activity  . Alcohol use: Yes    Alcohol/week: 0.0 - 4.0 standard drinks    Comment: a couple drinks a month  . Drug use: No  . Sexual activity: Not on file  Other Topics Concern  . Not on file  Social History Narrative  . Not on file   Social Determinants of Health   Financial Resource Strain: Low Risk   . Difficulty of Paying Living Expenses: Not hard at all  Food  Insecurity: No Food Insecurity  . Worried About Programme researcher, broadcasting/film/video in the Last Year: Never true  . Ran Out of Food in the Last Year: Never true  Transportation Needs: No Transportation Needs  . Lack of Transportation (Medical): No  . Lack of Transportation (Non-Medical): No  Physical Activity: Inactive  . Days of Exercise per Week: 0 days  . Minutes of Exercise per Session: 0 min  Stress: No Stress Concern Present  . Feeling of Stress : Not at all  Social Connections: Moderately Isolated  . Frequency of Communication with Friends and Family: More than three times a week  . Frequency of Social Gatherings with Friends and Family: Three times a week  . Attends Religious Services: Never  . Active Member of Clubs or Organizations: No  . Attends Banker Meetings: Never  . Marital Status: Married    Tobacco Counseling Counseling given: Not Answered Comment: only smoked in college   Clinical Intake:  Pre-visit preparation completed: Yes  Pain : No/denies pain     Nutritional Risks: None Diabetes: No  How often do you need to have someone help you when you read instructions, pamphlets, or other written materials from your doctor or pharmacy?: 1 - Never  Diabetic? No  Interpreter Needed?: No  Information entered by :: [facility], LPN   Activities of Daily Living In your present state of health, do you have any difficulty performing the following activities: 03/07/2020  Hearing? N  Vision? N  Difficulty concentrating or making decisions? N  Walking or climbing stairs? Y  Comment Due to ankle and knee pains.  Dressing or bathing? N  Doing errands, shopping? N  Preparing Food and eating ? N  Using the Toilet? N  In the past six months, have you accidently leaked urine? N  Do you have problems with loss of bowel control? N  Managing your Medications? N  Managing your Finances? N  Housekeeping or managing your Housekeeping? N  Some recent data might be  hidden    Patient Care Team: Erasmo Downer, MD as PCP - General (Family Medicine) Juanell Fairly, MD as Referring Physician (Orthopedic Surgery) Galen Manila, MD as Referring Physician (Ophthalmology) Debbrah Alar, MD (Dermatology)  Indicate any recent Medical Services you may have received from other than [facility] providers in the past year (date may be approximate).     Assessment:   This is a routine wellness examination for Idali.  Hearing/Vision screen No exam data present  Dietary issues and exercise activities discussed: Current Exercise Habits: The patient does not participate in regular exercise at present, Exercise limited by: None identified  Goals    . Exercise 3x per week (30 min per time)     Recommend doing some form of exercise for 3 days a week for at least 30 minutes.   02/24/18, Continue trying to walk or exercise for 3 days a  week for at least 30 mins a day.     Marland Kitchen LIFESTYLE - DECREASE FALLS RISK     Recommend to remove any items from the home that may cause slips or trips.      Depression Screen PHQ 2/9 Scores 03/07/2020 03/01/2019 02/24/2018 02/23/2017 02/23/2017 09/24/2015  PHQ - 2 Score 0 0 0 0 0 0  PHQ- 9 Score - - - 0 - -    Fall Risk Fall Risk  03/07/2020 03/01/2019 02/24/2018 02/23/2017 09/24/2015  Falls in the past year? 1 0 0 No No  Number falls in past yr: 0 0 - - -  Injury with Fall? 0 0 - - -  Follow up Falls prevention discussed - - - -    Any stairs in or around the home? Yes  If so, are there any without handrails? No  Home free of loose throw rugs in walkways, pet beds, electrical cords, etc? Yes  Adequate lighting in your home to reduce risk of falls? Yes   ASSISTIVE DEVICES UTILIZED TO PREVENT FALLS:  Life alert? No  Use of a cane, walker or w/c? No  Grab bars in the bathroom? No  Shower chair or bench in shower? No  Elevated toilet seat or a handicapped toilet? No    Cognitive Function: Declined today.         Immunizations Immunization History  Administered Date(s) Administered  . PFIZER SARS-COV-2 Vaccination 06/03/2019, 06/24/2019  . Pneumococcal Conjugate-13 09/24/2015  . Pneumococcal Polysaccharide-23 02/03/2013  . Td 06/24/2010  . Tdap 06/24/2010    TDAP status: Up to date Flu Vaccine status: Declined, Education has been provided regarding the importance of this vaccine but patient still declined. Advised may receive this vaccine at local pharmacy or Health Dept. Aware to provide a copy of the vaccination record if obtained from local pharmacy or Health Dept. Verbalized acceptance and understanding. Pneumococcal vaccine status: Up to date Covid-19 vaccine status: Completed vaccines  Qualifies for Shingles Vaccine? Yes   Zostavax completed No   Shingrix Completed?: No.    Education has been provided regarding the importance of this vaccine. Patient has been advised to call insurance company to determine out of pocket expense if they have not yet received this vaccine. Advised may also receive vaccine at local pharmacy or Health Dept. Verbalized acceptance and understanding.  Screening Tests Health Maintenance  Topic Date Due  . INFLUENZA VACCINE  07/18/2020 (Originally 11/19/2019)  . MAMMOGRAM  04/28/2020  . TETANUS/TDAP  06/23/2020  . COLONOSCOPY  12/16/2025  . DEXA SCAN  Completed  . COVID-19 Vaccine  Completed  . Hepatitis C Screening  Completed  . PNA vac Low Risk Adult  Completed    Health Maintenance  There are no preventive care reminders to display for this patient.  Colorectal cancer screening: Completed 12/17/15. Repeat every 10 years Mammogram status: Completed 04/28/18. Repeat every year. Ordered today for 2022. Bone Density status: Completed 10/23/15. Results reflect: Previous DEXA scan was normal. No repeat needed unless advised by a physician.  Lung Cancer Screening: (Low Dose CT Chest recommended if Age 27-80 years, 30 pack-year currently smoking OR have quit w/in  15years.) does not qualify.   Additional Screening:  Hepatitis C Screening: Up to date  Vision Screening: Recommended annual ophthalmology exams for early detection of glaucoma and other disorders of the eye. Is the patient up to date with their annual eye exam?  Yes  Who is the provider or what is the name of the office  in which the patient attends annual eye exams? Dr Druscilla BrowniePorfilio @ [facility] If pt is not established with a provider, would they like to be referred to a provider to establish care? No .   Dental Screening: Recommended annual dental exams for proper oral hygiene  Community Resource Referral / Chronic Care Management: CRR required this visit?  No   CCM required this visit?  No      Plan:     I have personally reviewed and noted the following in the patient's chart:   . Medical and social history . Use of alcohol, tobacco or illicit drugs  . Current medications and supplements . Functional ability and status . Nutritional status . Physical activity . Advanced directives . List of other physicians . Hospitalizations, surgeries, and ER visits in previous 12 months . Vitals . Screenings to include cognitive, depression, and falls . Referrals and appointments  In addition, I have reviewed and discussed with patient certain preventive protocols, quality metrics, and best practice recommendations. A written personalized care plan for preventive services as well as general preventive health recommendations were provided to patient.     Schyler Counsell [place], [place]   [id number]/18/2021   Nurse Notes: Pt declined receiving a flu shot.

## 2020-03-07 ENCOUNTER — Other Ambulatory Visit: Payer: Self-pay

## 2020-03-07 ENCOUNTER — Ambulatory Visit (INDEPENDENT_AMBULATORY_CARE_PROVIDER_SITE_OTHER): Payer: Medicare PPO

## 2020-03-07 DIAGNOSIS — Z0000 Encounter for general adult medical examination without abnormal findings: Secondary | ICD-10-CM | POA: Diagnosis not present

## 2020-03-07 DIAGNOSIS — Z1231 Encounter for screening mammogram for malignant neoplasm of breast: Secondary | ICD-10-CM | POA: Diagnosis not present

## 2020-03-07 NOTE — Patient Instructions (Signed)
Melissa Barry , Thank you for taking time to come for your Medicare Wellness Visit. I appreciate your ongoing commitment to your health goals. Please review the following plan we discussed and let me know if I can assist you in the future.   Screening recommendations/referrals: Colonoscopy: Up to date, due 11/2025 Mammogram: Currently due. Ordered mammogram today. Pt to call and schedule apt for 04/2020. Bone Density: Previous DEXA scan was normal. No repeat needed unless advised by a physician. Recommended yearly ophthalmology/optometry visit for glaucoma screening and checkup Recommended yearly dental visit for hygiene and checkup  Vaccinations: Influenza vaccine: Currently due, declined receiving. Pneumococcal vaccine: Completed series Tdap vaccine: Up to date, due 06/2020 Shingles vaccine: Shingrix discussed. Please contact your pharmacy for coverage information.     Advanced directives: Advance directive discussed with you today. Even though you declined this today please call our office should you change your mind and we can give you the proper paperwork for you to fill out.  Conditions/risks identified: Fall risk preventatives discussed today. Recommend to start exercising 3 days a week for at least 30 minutes at a time.   Next appointment: 05/03/19 @ 1:40 PM with Dr Beryle Flock    Preventive Care 65 Years and Older, Female Preventive care refers to lifestyle choices and visits with your health care provider that can promote health and wellness. What does preventive care include?  A yearly physical exam. This is also called an annual well check.  Dental exams once or twice a year.  Routine eye exams. Ask your health care provider how often you should have your eyes checked.  Personal lifestyle choices, including:  Daily care of your teeth and gums.  Regular physical activity.  Eating a healthy diet.  Avoiding tobacco and drug use.  Limiting alcohol use.  Practicing safe  sex.  Taking low-dose aspirin every day.  Taking vitamin and mineral supplements as recommended by your health care provider. What happens during an annual well check? The services and screenings done by your health care provider during your annual well check will depend on your age, overall health, lifestyle risk factors, and family history of disease. Counseling  Your health care provider may ask you questions about your:  Alcohol use.  Tobacco use.  Drug use.  Emotional well-being.  Home and relationship well-being.  Sexual activity.  Eating habits.  History of falls.  Memory and ability to understand (cognition).  Work and work Astronomer.  Reproductive health. Screening  You may have the following tests or measurements:  Height, weight, and BMI.  Blood pressure.  Lipid and cholesterol levels. These may be checked every 5 years, or more frequently if you are over 77 years old.  Skin check.  Lung cancer screening. You may have this screening every year starting at age 4 if you have a 30-pack-year history of smoking and currently smoke or have quit within the past 15 years.  Fecal occult blood test (FOBT) of the stool. You may have this test every year starting at age 65.  Flexible sigmoidoscopy or colonoscopy. You may have a sigmoidoscopy every 5 years or a colonoscopy every 10 years starting at age 20.  Hepatitis C blood test.  Hepatitis B blood test.  Sexually transmitted disease (STD) testing.  Diabetes screening. This is done by checking your blood sugar (glucose) after you have not eaten for a while (fasting). You may have this done every 1-3 years.  Bone density scan. This is done to screen for osteoporosis. You  may have this done starting at age 24.  Mammogram. This may be done every 1-2 years. Talk to your health care provider about how often you should have regular mammograms. Talk with your health care provider about your test results,  treatment options, and if necessary, the need for more tests. Vaccines  Your health care provider may recommend certain vaccines, such as:  Influenza vaccine. This is recommended every year.  Tetanus, diphtheria, and acellular pertussis (Tdap, Td) vaccine. You may need a Td booster every 10 years.  Zoster vaccine. You may need this after age 52.  Pneumococcal 13-valent conjugate (PCV13) vaccine. One dose is recommended after age 43.  Pneumococcal polysaccharide (PPSV23) vaccine. One dose is recommended after age 5. Talk to your health care provider about which screenings and vaccines you need and how often you need them. This information is not intended to replace advice given to you by your health care provider. Make sure you discuss any questions you have with your health care provider. Document Released: 05/03/2015 Document Revised: 12/25/2015 Document Reviewed: 02/05/2015 Elsevier Interactive Patient Education  2017 [place] Prevention in the Home Falls can cause injuries. They can happen to people of all ages. There are many things you can do to make your home safe and to help prevent falls. What can I do on the outside of my home?  Regularly fix the edges of walkways and driveways and fix any cracks.  Remove anything that might make you trip as you walk through a door, such as a raised step or threshold.  Trim any bushes or trees on the path to your home.  Use bright outdoor lighting.  Clear any walking paths of anything that might make someone trip, such as rocks or tools.  Regularly check to see if handrails are loose or broken. Make sure that both sides of any steps have handrails.  Any raised decks and porches should have guardrails on the edges.  Have any leaves, snow, or ice cleared regularly.  Use sand or salt on walking paths during winter.  Clean up any spills in your garage right away. This includes oil or grease spills. What can I do in the  bathroom?  Use night lights.  Install grab bars by the toilet and in the tub and shower. Do not use towel bars as grab bars.  Use non-skid mats or decals in the tub or shower.  If you need to sit down in the shower, use a plastic, non-slip stool.  Keep the floor dry. Clean up any water that spills on the floor as soon as it happens.  Remove soap buildup in the tub or shower regularly.  Attach bath mats securely with double-sided non-slip rug tape.  Do not have throw rugs and other things on the floor that can make you trip. What can I do in the bedroom?  Use night lights.  Make sure that you have a light by your bed that is easy to reach.  Do not use any sheets or blankets that are too big for your bed. They should not hang down onto the floor.  Have a firm chair that has side arms. You can use this for support while you get dressed.  Do not have throw rugs and other things on the floor that can make you trip. What can I do in the kitchen?  Clean up any spills right away.  Avoid walking on wet floors.  Keep items that you use a lot  in easy-to-reach places.  If you need to reach something above you, use a strong step stool that has a grab bar.  Keep electrical cords out of the way.  Do not use floor polish or wax that makes floors slippery. If you must use wax, use non-skid floor wax.  Do not have throw rugs and other things on the floor that can make you trip. What can I do with my stairs?  Do not leave any items on the stairs.  Make sure that there are handrails on both sides of the stairs and use them. Fix handrails that are broken or loose. Make sure that handrails are as long as the stairways.  Check any carpeting to make sure that it is firmly attached to the stairs. Fix any carpet that is loose or worn.  Avoid having throw rugs at the top or bottom of the stairs. If you do have throw rugs, attach them to the floor with carpet tape.  Make sure that you have a  light switch at the top of the stairs and the bottom of the stairs. If you do not have them, ask someone to add them for you. What else can I do to help prevent falls?  Wear shoes that:  Do not have high heels.  Have rubber bottoms.  Are comfortable and fit you well.  Are closed at the toe. Do not wear sandals.  If you use a stepladder:  Make sure that it is fully opened. Do not climb a closed stepladder.  Make sure that both sides of the stepladder are locked into place.  Ask someone to hold it for you, if possible.  Clearly mark and make sure that you can see:  Any grab bars or handrails.  First and last steps.  Where the edge of each step is.  Use tools that help you move around (mobility aids) if they are needed. These include:  Canes.  Walkers.  Scooters.  Crutches.  Turn on the lights when you go into a dark area. Replace any light bulbs as soon as they burn out.  Set up your furniture so you have a clear path. Avoid moving your furniture around.  If any of your floors are uneven, fix them.  If there are any pets around you, be aware of where they are.  Review your medicines with your doctor. Some medicines can make you feel dizzy. This can increase your chance of falling. Ask your doctor what other things that you can do to help prevent falls. This information is not intended to replace advice given to you by your health care provider. Make sure you discuss any questions you have with your health care provider. Document Released: 01/31/2009 Document Revised: 09/12/2015 Document Reviewed: 05/11/2014 Elsevier Interactive Patient Education  2017 [employer].

## 2020-05-02 ENCOUNTER — Other Ambulatory Visit: Payer: Self-pay

## 2020-05-02 ENCOUNTER — Ambulatory Visit (INDEPENDENT_AMBULATORY_CARE_PROVIDER_SITE_OTHER): Payer: Medicare PPO | Admitting: Family Medicine

## 2020-05-02 ENCOUNTER — Encounter: Payer: Self-pay | Admitting: Family Medicine

## 2020-05-02 VITALS — BP 145/82 | HR 76 | Temp 98.3°F | Ht 65.0 in | Wt 202.2 lb

## 2020-05-02 DIAGNOSIS — Z8349 Family history of other endocrine, nutritional and metabolic diseases: Secondary | ICD-10-CM | POA: Diagnosis not present

## 2020-05-02 DIAGNOSIS — E7800 Pure hypercholesterolemia, unspecified: Secondary | ICD-10-CM | POA: Diagnosis not present

## 2020-05-02 DIAGNOSIS — R030 Elevated blood-pressure reading, without diagnosis of hypertension: Secondary | ICD-10-CM | POA: Diagnosis not present

## 2020-05-02 DIAGNOSIS — E669 Obesity, unspecified: Secondary | ICD-10-CM | POA: Diagnosis not present

## 2020-05-02 DIAGNOSIS — Z0000 Encounter for general adult medical examination without abnormal findings: Secondary | ICD-10-CM | POA: Diagnosis not present

## 2020-05-02 DIAGNOSIS — Z6833 Body mass index (BMI) 33.0-33.9, adult: Secondary | ICD-10-CM | POA: Diagnosis not present

## 2020-05-02 NOTE — Assessment & Plan Note (Signed)
Reviewed last lipid panel Not currently on a statin Recheck FLP and CMP Discussed diet and exercise  

## 2020-05-02 NOTE — Assessment & Plan Note (Signed)
Discussed importance of healthy weight management Discussed diet and exercise  

## 2020-05-02 NOTE — Patient Instructions (Signed)
Preventive Care 73 Years and Older, Female Preventive care refers to lifestyle choices and visits with your health care provider that can promote health and wellness. This includes:  A yearly physical exam. This is also called an annual wellness visit.  Regular dental and eye exams.  Immunizations.  Screening for certain conditions.  Healthy lifestyle choices, such as: ? Eating a healthy diet. ? Getting regular exercise. ? Not using drugs or products that contain nicotine and tobacco. ? Limiting alcohol use. What can I expect for my preventive care visit? Physical exam Your health care provider will check your:  Height and weight. These may be used to calculate your BMI (body mass index). BMI is a measurement that tells if you are at a healthy weight.  Heart rate and blood pressure.  Body temperature.  Skin for abnormal spots. Counseling Your health care provider may ask you questions about your:  Past medical problems.  Family's medical history.  Alcohol, tobacco, and drug use.  Emotional well-being.  Home life and relationship well-being.  Sexual activity.  Diet, exercise, and sleep habits.  History of falls.  Memory and ability to understand (cognition).  Work and work Statistician.  Pregnancy and menstrual history.  Access to firearms. What immunizations do I need? Vaccines are usually given at various ages, according to a schedule. Your health care provider will recommend vaccines for you based on your age, medical history, and lifestyle or other factors, such as travel or where you work.   What tests do I need? Blood tests  Lipid and cholesterol levels. These may be checked every 5 years, or more often depending on your overall health.  Hepatitis C test.  Hepatitis B test. Screening  Lung cancer screening. You may have this screening every year starting at age 73 if you have a 30-pack-year history of smoking and currently smoke or have quit within  the past 15 years.  Colorectal cancer screening. ? All adults should have this screening starting at age 73 and continuing until age 58. ? Your health care provider may recommend screening at age 2 if you are at increased risk. ? You will have tests every 1-10 years, depending on your results and the type of screening test.  Diabetes screening. ? This is done by checking your blood sugar (glucose) after you have not eaten for a while (fasting). ? You may have this done every 1-3 years.  Mammogram. ? This may be done every 1-2 years. ? Talk with your health care provider about how often you should have regular mammograms.  Abdominal aortic aneurysm (AAA) screening. You may need this if you are a current or former smoker.  BRCA-related cancer screening. This may be done if you have a family history of breast, ovarian, tubal, or peritoneal cancers. Other tests  STD (sexually transmitted disease) testing, if you are at risk.  Bone density scan. This is done to screen for osteoporosis. You may have this done starting at age 73. Talk with your health care provider about your test results, treatment options, and if necessary, the need for more tests. Follow these instructions at home: Eating and drinking  Eat a diet that includes fresh fruits and vegetables, whole grains, lean protein, and low-fat dairy products. Limit your intake of foods with high amounts of sugar, saturated fats, and salt.  Take vitamin and mineral supplements as recommended by your health care provider.  Do not drink alcohol if your health care provider tells you not to drink.  If you drink alcohol: ? Limit how much you have to 0-1 drink a day. ? Be aware of how much alcohol is in your drink. In the [place] one drink equals one 12 oz bottle of beer (355 mL), one 5 oz glass of wine (148 mL), or one 1 oz glass of hard liquor (44 mL).   Lifestyle  Take daily care of your teeth and gums. Brush your teeth every morning  and night with fluoride toothpaste. Floss one time each day.  Stay active. Exercise for at least 30 minutes 5 or more days each week.  Do not use any products that contain nicotine or tobacco, such as cigarettes, e-cigarettes, and chewing tobacco. If you need help quitting, ask your health care provider.  Do not use drugs.  If you are sexually active, practice safe sex. Use a condom or other form of protection in order to prevent STIs (sexually transmitted infections).  Talk with your health care provider about taking a low-dose aspirin or statin.  Find healthy ways to cope with stress, such as: ? Meditation, yoga, or listening to music. ? Journaling. ? Talking to a trusted person. ? Spending time with friends and family. Safety  Always wear your seat belt while driving or riding in a vehicle.  Do not drive: ? If you have been drinking alcohol. Do not ride with someone who has been drinking. ? When you are tired or distracted. ? While texting.  Wear a helmet and other protective equipment during sports activities.  If you have firearms in your house, make sure you follow all gun safety procedures. What's next?  Visit your health care provider once a year for an annual wellness visit.  Ask your health care provider how often you should have your eyes and teeth checked.  Stay up to date on all vaccines. This information is not intended to replace advice given to you by your health care provider. Make sure you discuss any questions you have with your health care provider. Document Revised: 03/27/2020 Document Reviewed: 03/31/2018 Elsevier Patient Education  2021 Elsevier Inc.  

## 2020-05-02 NOTE — Assessment & Plan Note (Signed)
Elevated today, but typically normal Discussed lifestyle management Monitor home BPs Repeat at next visit or sooner if elevated at home

## 2020-05-02 NOTE — Progress Notes (Signed)
Annual Physical     Patient: Melissa Barry, Female    DOB: 1945-05-19, 75 y.o.   MRN: [id number] Visit Date: 05/02/2020  Today's Provider: Shirlee Latch, MD   Chief Complaint  Patient presents with  . Annual Exam   Subjective    Melissa Barry is a 75 y.o. female who presents today for her Annual Wellness Visit. She reports consuming a general and low sodium diet. The patient does not participate in regular exercise at present. She generally feels fairly well, due to left foot pain. Patient reports burning around the ankles and painful with walking. She reports sleeping fairly well, due to waking up to urinate every couple of hours throughout the night. She does have additional problems to discuss today.   HPI  Worried about gaining weight. Does not exercise.  Doesn't think that diet has changed. Does not want to continue to gain weight.   Patient Active Problem List   Diagnosis Date Noted  . Class 1 obesity without serious comorbidity with body mass index (BMI) of 33.0 to 33.9 in adult 05/02/2020  . Peripheral neuropathy 02/24/2018  . Achilles tendinitis of both lower extremities 02/24/2018  . Tachycardia, paroxysmal (HCC) 10/15/2017  . Elevated BP without diagnosis of hypertension 02/24/2017  . Constipation 06/25/2015  . Hypercholesterolemia 06/13/2015  . 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 Arthritis, degenerative 06/13/2015  . 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 BREAST BIOPSY Left years ago   Dr. Lemar Livings   . COLONOSCOPY WITH PROPOFOL N/A 12/17/2015   Procedure: COLONOSCOPY WITH PROPOFOL;  Surgeon: Midge Minium, MD;  Location: [facility] ENDOSCOPY;  Service: Endoscopy;  Laterality: N/A;  . DILATION AND CURETTAGE OF UTERUS    . TONSILLECTOMY AND ADENOIDECTOMY     Social History   Socioeconomic History  . Marital status: Married    Spouse name: Not on file  . Number of children: 2  . Years of education: Not on file  . Highest  education level: Some college, no degree  Occupational History  . Occupation: retired  Tobacco Use  . Smoking status: Former Smoker    Years: 2.00    Types: Cigarettes  . Smokeless tobacco: Never Used  . Tobacco comment: only smoked in college  Vaping Use  . Vaping Use: Never used  Substance and Sexual Activity  . Alcohol use: Yes    Alcohol/week: 0.0 - 4.0 standard drinks    Comment: a couple drinks a month  . Drug use: No  . Sexual activity: Not on file  Other Topics Concern  . Not on file  Social History Narrative  . Not on file   Social Determinants of Health   Financial Resource Strain: Low Risk   . Difficulty of Paying Living Expenses: Not hard at all  Food Insecurity: No Food Insecurity  . Worried About Programme researcher, broadcasting/film/video in the Last Year: Never true  . Ran Out of Food in the Last Year: Never true  Transportation Needs: No Transportation Needs  . Lack of Transportation (Medical): No  . Lack of Transportation (Non-Medical): No  Physical Activity: Inactive  . Days of Exercise per Week: 0 days  . Minutes of Exercise per Session: 0 min  Stress: No Stress Concern Present  . Feeling of Stress : Not at all  Social Connections: Moderately Isolated  . Frequency of Communication with Friends and Family: More than three times a week  . Frequency of Social Gatherings with  Friends and Family: Three times a week  . Attends Religious Services: Never  . Active Member of Clubs or Organizations: No  . Attends Banker Meetings: Never  . Marital Status: Married  Catering manager Violence: Not At Risk  . Fear of Current or Ex-Partner: No  . Emotionally Abused: No  . Physically Abused: No  . Sexually Abused: No   Family History  Problem Relation Age of Onset  . Ovarian cancer Mother   . Heart disease Father   . Lung cancer Father   . Thyroid disease Father   . Congestive Heart Failure Father   . Hepatitis Sister   . Celiac disease Sister   . Migraines  Daughter   . Migraines Son   . Sarcoidosis Son   . Parkinson's disease Maternal Grandfather   . Stroke Paternal Grandmother   . Lung cancer Paternal Grandfather    Allergies  Allergen Reactions  . Doxycycline Other (See Comments)    Made her feel sick the whole time she was taking it.   Marland Kitchen Penicillins Rash  . Sulfa Antibiotics Swelling and Rash    Patient states throat swelling        Medications: Outpatient Medications Prior to Visit  Medication Sig  . ibuprofen (ADVIL) 200 MG tablet Take 200 mg by mouth every 6 (six) hours as needed.  . [DISCONTINUED] predniSONE (DELTASONE) 20 MG tablet Take 60mg  PO daily x 2 days, then40mg  PO daily x 2 days, then 20mg  PO daily x 3 days (Patient not taking: Reported on 03/07/2020)   No facility-administered medications prior to visit.    Allergies  Allergen Reactions  . Doxycycline Other (See Comments)    Made her feel sick the whole time she was taking it.   Penicillins Rash  . Sulfa Antibiotics Swelling and Rash    Patient states throat swelling     Patient Care Team: 03/09/2020, MD as PCP - General (Family Medicine) Marland Kitchen, MD as Referring Physician (Orthopedic Surgery) Erasmo Downer, MD as Referring Physician (Ophthalmology) Juanell Fairly, MD (Dermatology)  Review of Systems  Constitutional: Negative.   HENT: Negative.   Eyes: Negative.   Respiratory: Negative.   Cardiovascular: Negative.   Gastrointestinal: Negative.   Endocrine: Negative.   Genitourinary: Negative.   Musculoskeletal: Negative.   Skin: Negative.   Allergic/Immunologic: Negative.   Neurological: Negative.   Hematological: Negative.   Psychiatric/Behavioral: Negative.     Last CBC Lab Results  Component Value Date   WBC 6.0 09/20/2017   HGB 15.0 09/20/2017   HCT 43.6 09/20/2017   MCV 90.5 09/20/2017   MCH 31.1 09/20/2017   RDW 13.3 09/20/2017   PLT 204 09/20/2017   Last metabolic panel Lab Results  Component  Value Date   GLUCOSE 87 03/01/2019   NA 139 03/01/2019   K 4.5 03/01/2019   CL 103 03/01/2019   CO2 21 03/01/2019   BUN 17 03/01/2019   CREATININE 0.78 03/01/2019   GFRNONAA 77 03/01/2019   GFRAA 88 03/01/2019   CALCIUM 9.4 03/01/2019   PROT 6.4 03/01/2019   ALBUMIN 4.4 03/01/2019   LABGLOB 2.0 03/01/2019   AGRATIO 2.2 03/01/2019   BILITOT 0.7 03/01/2019   ALKPHOS 76 03/01/2019   AST 22 03/01/2019   ALT 18 03/01/2019   ANIONGAP 11 09/20/2017   Last lipids Lab Results  Component Value Date   CHOL 266 (H) 03/01/2019   HDL 71 03/01/2019   LDLCALC 173 (H) 03/01/2019   TRIG  124 03/01/2019   CHOLHDL 3.7 03/01/2019   Last hemoglobin A1c Lab Results  Component Value Date   HGBA1C 5.1 09/25/2015   Last thyroid functions Lab Results  Component Value Date   TSH 0.740 09/25/2015   Last vitamin B12 and Folate Lab Results  Component Value Date   VITAMINB12 635 02/24/2018      Objective    Vitals: BP (!) 145/82 (BP Location: Left Arm, Patient Position: Sitting, Cuff Size: Large)   Pulse 76   Temp 98.3 F (36.8 C) (Oral)   Ht 5\' 5"  (1.651 m)   Wt 202 lb 3.2 oz (91.7 kg)   SpO2 97%   BMI 33.65 kg/m  BP Readings from Last 3 Encounters:  05/02/20 (!) 145/82  03/01/19 132/82  03/01/19 132/82   Wt Readings from Last 3 Encounters:  05/02/20 202 lb 3.2 oz (91.7 kg)  03/01/19 194 lb (88 kg)  03/01/19 194 lb 9.6 oz (88.3 kg)      Physical Exam Vitals reviewed.  Constitutional:      General: She is not in acute distress.    Appearance: Normal appearance. She is well-developed. She is not diaphoretic.  HENT:     Head: Normocephalic and atraumatic.     Right Ear: Tympanic membrane, ear canal and external ear normal.     Left Ear: Tympanic membrane, ear canal and external ear normal.  Eyes:     General: No scleral icterus.    Conjunctiva/sclera: Conjunctivae normal.     Pupils: Pupils are equal, round, and reactive to light.  Neck:     Thyroid: No thyromegaly.   Cardiovascular:     Rate and Rhythm: Normal rate and regular rhythm.     Pulses: Normal pulses.     Heart sounds: Normal heart sounds. No murmur heard.   Pulmonary:     Effort: Pulmonary effort is normal. No respiratory distress.     Breath sounds: Normal breath sounds. No wheezing or rales.  Abdominal:     General: There is no distension.     Palpations: Abdomen is soft.     Tenderness: There is no abdominal tenderness.  Musculoskeletal:        General: No deformity.     Cervical back: Neck supple.     Right lower leg: No edema.     Left lower leg: No edema.  Lymphadenopathy:     Cervical: No cervical adenopathy.  Skin:    General: Skin is warm and dry.     Findings: No rash.  Neurological:     Mental Status: She is alert and oriented to person, place, and time. Mental status is at baseline.     Sensory: No sensory deficit.     Motor: No weakness.     Gait: Gait normal.  Psychiatric:        Mood and Affect: Mood normal.        Behavior: Behavior normal.        Thought Content: Thought content normal.      Most recent functional status assessment: In your present state of health, do you have any difficulty performing the following activities: 05/02/2020  Hearing? N  Vision? N  Comment wear reading glasses per pt  Difficulty concentrating or making decisions? N  Walking or climbing stairs? Y  Comment -  Dressing or bathing? N  Doing errands, shopping? N  Preparing Food and eating ? -  Using the Toilet? -  In the past six months, have you accidently  leaked urine? -  Do you have problems with loss of bowel control? -  Managing your Medications? -  Managing your Finances? -  Housekeeping or managing your Housekeeping? -  Some recent data might be hidden   Most recent fall risk assessment: Fall Risk  05/02/2020  Falls in the past year? 1  Number falls in past yr: 0  Injury with Fall? 0  Risk for fall due to : History of fall(s)  Follow up Falls evaluation  completed;Education provided;Falls prevention discussed    Most recent depression screenings: PHQ 2/9 Scores 05/02/2020 03/07/2020  PHQ - 2 Score 0 0  PHQ- 9 Score 0 -   Most recent cognitive screening: 6CIT Screen 05/02/2020  What Year? 0 points  What month? 0 points  What time? 0 points  Count back from 20 0 points  Months in reverse 0 points  Repeat phrase 0 points  Total Score 0   Most recent Audit-C alcohol use screening Alcohol Use Disorder Test (AUDIT) 05/02/2020  1. How often do you have a drink containing alcohol? 2  2. How many drinks containing alcohol do you have on a typical day when you are drinking? 0  3. How often do you have six or more drinks on one occasion? 0  AUDIT-C Score 2  Alcohol Brief Interventions/Follow-up -   A score of 3 or more in women, and 4 or more in men indicates increased risk for alcohol abuse, EXCEPT if all of the points are from question 1   No results found for any visits on 05/02/20.  Assessment & Plan     Annual wellness visit done today including the all of the following: Reviewed patient's Family Medical History Reviewed and updated list of patient's medical providers Assessment of cognitive impairment was done Assessed patient's functional ability Established a written schedule for health screening services Health Risk Assessent Completed and Reviewed  Exercise Activities and Dietary recommendations Goals    . Exercise 3x per week (30 min per time)     Recommend doing some form of exercise for 3 days a week for at least 30 minutes.   02/24/18, Continue trying to walk or exercise for 3 days a week for at least 30 mins a day.     Marland Kitchen LIFESTYLE - DECREASE FALLS RISK     Recommend to remove any items from the home that may cause slips or trips.       Immunization History  Administered Date(s) Administered  . PFIZER SARS-COV-2 Vaccination 06/03/2019, 06/24/2019, 01/25/2020  . Pneumococcal Conjugate-13 09/24/2015  .  Pneumococcal Polysaccharide-23 02/03/2013  . Td 06/24/2010  . Tdap 06/24/2010    Health Maintenance  Topic Date Due  . MAMMOGRAM  04/28/2020  . INFLUENZA VACCINE  07/18/2020 (Originally 11/19/2019)  . TETANUS/TDAP  06/23/2020  . COLONOSCOPY (Pts 45-11yrs Insurance coverage will need to be confirmed)  12/16/2025  . DEXA SCAN  Completed  . COVID-19 Vaccine  Completed  . Hepatitis C Screening  Completed  . PNA vac Low Risk Adult  Completed     Discussed health benefits of physical activity, and encouraged her to engage in regular exercise appropriate for her age and condition.    Problem List Items Addressed This Visit      Other   Hypercholesterolemia    Reviewed last lipid panel Not currently on a statin Recheck FLP and CMP Discussed diet and exercise       Relevant Orders   Lipid panel   Comprehensive metabolic  panel   Elevated BP without diagnosis of hypertension    Elevated today, but typically normal Discussed lifestyle management Monitor home BPs Repeat at next visit or sooner if elevated at home      Class 1 obesity without serious comorbidity with body mass index (BMI) of 33.0 to 33.9 in adult    Discussed importance of healthy weight management Discussed diet and exercise       Relevant Orders   TSH   Lipid panel   Comprehensive metabolic panel    Other Visit Diagnoses    Encounter for annual physical exam    -  Primary   Relevant Orders   TSH   Lipid panel   Comprehensive metabolic panel   Family history of thyroid disease       Relevant Orders   TSH       Return in about 6 months (around 10/30/2020) for chronic disease f/u.     I, Shirlee LatchAngela Magdeline Prange, MD, have reviewed all documentation for this visit. The documentation on 05/02/20 for the exam, diagnosis, procedures, and orders are all accurate and complete.   Benigno Check, Marzella SchleinAngela M, MD, MPH [facility]

## 2020-05-03 DIAGNOSIS — Z6833 Body mass index (BMI) 33.0-33.9, adult: Secondary | ICD-10-CM | POA: Diagnosis not present

## 2020-05-03 DIAGNOSIS — Z8349 Family history of other endocrine, nutritional and metabolic diseases: Secondary | ICD-10-CM | POA: Diagnosis not present

## 2020-05-03 DIAGNOSIS — E669 Obesity, unspecified: Secondary | ICD-10-CM | POA: Diagnosis not present

## 2020-05-03 DIAGNOSIS — Z0000 Encounter for general adult medical examination without abnormal findings: Secondary | ICD-10-CM | POA: Diagnosis not present

## 2020-05-03 DIAGNOSIS — E7800 Pure hypercholesterolemia, unspecified: Secondary | ICD-10-CM | POA: Diagnosis not present

## 2020-05-04 LAB — COMPREHENSIVE METABOLIC PANEL
ALT: 19 IU/L (ref 0–32)
AST: 23 IU/L (ref 0–40)
Albumin/Globulin Ratio: 2 (ref 1.2–2.2)
Albumin: 4.3 g/dL (ref 3.7–4.7)
Alkaline Phosphatase: 82 IU/L (ref 44–121)
BUN/Creatinine Ratio: 18 (ref 12–28)
BUN: 15 mg/dL (ref 8–27)
Bilirubin Total: 0.7 mg/dL (ref 0.0–1.2)
CO2: 21 mmol/L (ref 20–29)
Calcium: 9.6 mg/dL (ref 8.7–10.3)
Chloride: 105 mmol/L (ref 96–106)
Creatinine, Ser: 0.83 mg/dL (ref 0.57–1.00)
GFR calc Af Amer: 81 mL/min/{1.73_m2} (ref >59)
GFR calc non Af Amer: 71 mL/min/{1.73_m2} (ref >59)
Globulin, Total: 2.2 g/dL (ref 1.5–4.5)
Glucose: 92 mg/dL (ref 65–99)
Potassium: 4.5 mmol/L (ref 3.5–5.2)
Sodium: 143 mmol/L (ref 134–144)
Total Protein: 6.5 g/dL (ref 6.0–8.5)

## 2020-05-04 LAB — LIPID PANEL
Chol/HDL Ratio: 3.5 ratio (ref 0.0–4.4)
Cholesterol, Total: 275 mg/dL — ABNORMAL HIGH (ref 100–199)
HDL: 79 mg/dL (ref >39)
LDL Chol Calc (NIH): 180 mg/dL — ABNORMAL HIGH (ref 0–99)
Triglycerides: 95 mg/dL (ref 0–149)
VLDL Cholesterol Cal: 16 mg/dL (ref 5–40)

## 2020-05-04 LAB — TSH: TSH: 1.33 u[IU]/mL (ref 0.450–4.500)

## 2020-05-24 ENCOUNTER — Ambulatory Visit
Admission: RE | Admit: 2020-05-24 | Discharge: 2020-05-24 | Disposition: A | Discharge status: Home / Self Care | Payer: Medicare PPO | Source: Ambulatory Visit | Attending: Family Medicine | Admitting: Family Medicine

## 2020-05-24 ENCOUNTER — Other Ambulatory Visit: Payer: Self-pay

## 2020-05-24 DIAGNOSIS — Z1231 Encounter for screening mammogram for malignant neoplasm of breast: Secondary | ICD-10-CM | POA: Insufficient documentation

## 2020-09-25 DIAGNOSIS — H2511 Age-related nuclear cataract, right eye: Secondary | ICD-10-CM | POA: Diagnosis not present

## 2020-09-25 DIAGNOSIS — H353131 Nonexudative age-related macular degeneration, bilateral, early dry stage: Secondary | ICD-10-CM | POA: Diagnosis not present

## 2020-09-25 DIAGNOSIS — Z0100 Encounter for examination of eyes and vision without abnormal findings: Secondary | ICD-10-CM | POA: Diagnosis not present

## 2020-09-27 DIAGNOSIS — D2272 Melanocytic nevi of left lower limb, including hip: Secondary | ICD-10-CM | POA: Diagnosis not present

## 2020-09-27 DIAGNOSIS — L720 Epidermal cyst: Secondary | ICD-10-CM | POA: Diagnosis not present

## 2020-09-27 DIAGNOSIS — D2271 Melanocytic nevi of right lower limb, including hip: Secondary | ICD-10-CM | POA: Diagnosis not present

## 2020-09-27 DIAGNOSIS — D225 Melanocytic nevi of trunk: Secondary | ICD-10-CM | POA: Diagnosis not present

## 2020-09-27 DIAGNOSIS — D2261 Melanocytic nevi of right upper limb, including shoulder: Secondary | ICD-10-CM | POA: Diagnosis not present

## 2020-09-27 DIAGNOSIS — R233 Spontaneous ecchymoses: Secondary | ICD-10-CM | POA: Diagnosis not present

## 2020-09-27 DIAGNOSIS — D2262 Melanocytic nevi of left upper limb, including shoulder: Secondary | ICD-10-CM | POA: Diagnosis not present

## 2020-10-09 DIAGNOSIS — H2511 Age-related nuclear cataract, right eye: Secondary | ICD-10-CM | POA: Diagnosis not present

## 2020-10-15 ENCOUNTER — Encounter: Payer: Self-pay | Admitting: Ophthalmology

## 2020-10-28 NOTE — Anesthesia Preprocedure Evaluation (Addendum)
Anesthesia Evaluation  Patient identified by MRN, date of birth, ID band Patient awake    Reviewed: Allergy & Precautions, NPO status , Patient's Chart, lab work & pertinent test results  History of Anesthesia Complications Negative for: history of anesthetic complications  Airway Mallampati: IV   Neck ROM: Full    Dental   Upper bridge:   Pulmonary former smoker (quit in 39s),    Pulmonary exam normal breath sounds clear to auscultation       Cardiovascular Exercise Tolerance: Good negative cardio ROS Normal cardiovascular exam Rhythm:Regular Rate:Normal     Neuro/Psych negative neurological ROS     GI/Hepatic negative GI ROS,   Endo/Other  Obesity   Renal/GU negative Renal ROS     Musculoskeletal   Abdominal   Peds  Hematology negative hematology ROS (+)   Anesthesia Other Findings   Reproductive/Obstetrics                            Anesthesia Physical Anesthesia Plan  ASA: 2  Anesthesia Plan: MAC   Post-op Pain Management:    Induction: Intravenous  PONV Risk Score and Plan: 2 and TIVA, Midazolam and Treatment may vary due to age or medical condition  Airway Management Planned: Nasal Cannula  Additional Equipment:   Intra-op Plan:   Post-operative Plan:   Informed Consent: I have reviewed the patients History and Physical, chart, labs and discussed the procedure including the risks, benefits and alternatives for the proposed anesthesia with the patient or authorized representative who has indicated his/her understanding and acceptance.       Plan Discussed with: CRNA  Anesthesia Plan Comments:        Anesthesia Quick Evaluation

## 2020-10-29 ENCOUNTER — Ambulatory Visit: Payer: Medicare PPO | Admitting: Anesthesiology

## 2020-10-29 ENCOUNTER — Ambulatory Visit
Admission: RE | Admit: 2020-10-29 | Discharge: 2020-10-29 | Disposition: A | Discharge status: Home / Self Care | Payer: Medicare PPO | Attending: Ophthalmology | Admitting: Ophthalmology

## 2020-10-29 ENCOUNTER — Encounter: Payer: Self-pay | Admitting: Ophthalmology

## 2020-10-29 ENCOUNTER — Other Ambulatory Visit: Payer: Self-pay

## 2020-10-29 ENCOUNTER — Encounter
Admission: RE | Disposition: A | Discharge status: Home / Self Care | Payer: Self-pay | Source: Home / Self Care | Attending: Ophthalmology

## 2020-10-29 DIAGNOSIS — H25811 Combined forms of age-related cataract, right eye: Secondary | ICD-10-CM | POA: Diagnosis not present

## 2020-10-29 DIAGNOSIS — E669 Obesity, unspecified: Secondary | ICD-10-CM | POA: Diagnosis not present

## 2020-10-29 DIAGNOSIS — Z882 Allergy status to sulfonamides status: Secondary | ICD-10-CM | POA: Insufficient documentation

## 2020-10-29 DIAGNOSIS — Z8041 Family history of malignant neoplasm of ovary: Secondary | ICD-10-CM | POA: Insufficient documentation

## 2020-10-29 DIAGNOSIS — Z87891 Personal history of nicotine dependence: Secondary | ICD-10-CM | POA: Diagnosis not present

## 2020-10-29 DIAGNOSIS — H2511 Age-related nuclear cataract, right eye: Secondary | ICD-10-CM | POA: Insufficient documentation

## 2020-10-29 DIAGNOSIS — Z791 Long term (current) use of non-steroidal anti-inflammatories (NSAID): Secondary | ICD-10-CM | POA: Diagnosis not present

## 2020-10-29 DIAGNOSIS — Z820 Family history of epilepsy and other diseases of the nervous system: Secondary | ICD-10-CM | POA: Diagnosis not present

## 2020-10-29 DIAGNOSIS — Z880 Allergy status to penicillin: Secondary | ICD-10-CM | POA: Diagnosis not present

## 2020-10-29 DIAGNOSIS — Z79899 Other long term (current) drug therapy: Secondary | ICD-10-CM | POA: Diagnosis not present

## 2020-10-29 DIAGNOSIS — Z6833 Body mass index (BMI) 33.0-33.9, adult: Secondary | ICD-10-CM | POA: Insufficient documentation

## 2020-10-29 DIAGNOSIS — Z801 Family history of malignant neoplasm of trachea, bronchus and lung: Secondary | ICD-10-CM | POA: Insufficient documentation

## 2020-10-29 DIAGNOSIS — Z823 Family history of stroke: Secondary | ICD-10-CM | POA: Diagnosis not present

## 2020-10-29 DIAGNOSIS — Z8249 Family history of ischemic heart disease and other diseases of the circulatory system: Secondary | ICD-10-CM | POA: Diagnosis not present

## 2020-10-29 HISTORY — PX: CATARACT EXTRACTION W/PHACO: SHX586

## 2020-10-29 SURGERY — PHACOEMULSIFICATION, CATARACT, WITH IOL INSERTION
Anesthesia: Monitor Anesthesia Care | Site: Eye | Laterality: Right

## 2020-10-29 MED ORDER — LIDOCAINE HCL (PF) 2 % IJ SOLN
INTRAOCULAR | Status: DC | PRN
  Administered 2020-10-29: 2 mL

## 2020-10-29 MED ORDER — ACETAMINOPHEN 160 MG/5ML PO SOLN: 325.0000 mg | ORAL | Status: DC | PRN

## 2020-10-29 MED ORDER — SIGHTPATH DOSE#1 BSS IO SOLN
INTRAOCULAR | Status: DC | PRN
  Administered 2020-10-29: 70 mL via OPHTHALMIC

## 2020-10-29 MED ORDER — DEXAMETHASONE 0.4 MG OP INST
VAGINAL_INSERT | OPHTHALMIC | Status: DC | PRN
  Administered 2020-10-29: 0.4 mg via OPHTHALMIC

## 2020-10-29 MED ORDER — ONDANSETRON HCL 4 MG/2ML IJ SOLN: 4.0000 mg | Freq: Once | INTRAMUSCULAR | Status: DC | PRN

## 2020-10-29 MED ORDER — FENTANYL CITRATE (PF) 100 MCG/2ML IJ SOLN
INTRAMUSCULAR | Status: DC | PRN
  Administered 2020-10-29: 50 ug via INTRAVENOUS

## 2020-10-29 MED ORDER — BRIMONIDINE TARTRATE-TIMOLOL 0.2-0.5 % OP SOLN
OPHTHALMIC | Status: DC | PRN
  Administered 2020-10-29: 1 [drp] via OPHTHALMIC

## 2020-10-29 MED ORDER — PHENYLEPHRINE HCL 10 % OP SOLN
1.0000 [drp] | OPHTHALMIC | Status: DC | PRN
  Administered 2020-10-29 (×3): 1 [drp] via OPHTHALMIC

## 2020-10-29 MED ORDER — MIDAZOLAM HCL 2 MG/2ML IJ SOLN
INTRAMUSCULAR | Status: DC | PRN
  Administered 2020-10-29: 2 mg via INTRAVENOUS

## 2020-10-29 MED ORDER — ACETAMINOPHEN 325 MG PO TABS: 650.0000 mg | ORAL_TABLET | Freq: Once | ORAL | Status: DC | PRN

## 2020-10-29 MED ORDER — TETRACAINE HCL 0.5 % OP SOLN
1.0000 [drp] | OPHTHALMIC | Status: DC | PRN
  Administered 2020-10-29 (×3): 1 [drp] via OPHTHALMIC

## 2020-10-29 MED ORDER — MOXIFLOXACIN HCL 0.5 % OP SOLN
OPHTHALMIC | Status: DC | PRN
  Administered 2020-10-29: 0.2 mL via OPHTHALMIC

## 2020-10-29 MED ORDER — SIGHTPATH DOSE#1 NA CHONDROIT SULF-NA HYALURON 40-17 MG/ML IO SOLN
INTRAOCULAR | Status: DC | PRN
  Administered 2020-10-29: 1 mL via INTRAOCULAR

## 2020-10-29 MED ORDER — LACTATED RINGERS IV SOLN: INTRAVENOUS | Status: DC

## 2020-10-29 MED ORDER — CYCLOPENTOLATE HCL 2 % OP SOLN
1.0000 [drp] | OPHTHALMIC | Status: DC | PRN
  Administered 2020-10-29 (×3): 1 [drp] via OPHTHALMIC

## 2020-10-29 SURGICAL SUPPLY — 16 items
CANNULA ANT/CHMB 27GA (MISCELLANEOUS) ×4 IMPLANT
GLOVE SURG ENC TEXT LTX SZ8 (GLOVE) ×2 IMPLANT
GLOVE SURG TRIUMPH 8.0 PF LTX (GLOVE) ×2 IMPLANT
GOWN STRL REUS W/ TWL LRG LVL3 (GOWN DISPOSABLE) ×2 IMPLANT
GOWN STRL REUS W/TWL LRG LVL3 (GOWN DISPOSABLE) ×4
LENS IOL ACRSF IQ VT 15 18.5 ×1 IMPLANT
LENS IOL ACRYSOF VIVITY 18.5 ×2 IMPLANT
LENS IOL VIVITY 015 18.5 ×1 IMPLANT
MARKER SKIN DUAL TIP RULER LAB (MISCELLANEOUS) ×2 IMPLANT
NEEDLE FILTER BLUNT 18X 1/2SAF (NEEDLE) ×1
NEEDLE FILTER BLUNT 18X1 1/2 (NEEDLE) ×1 IMPLANT
PACK EYE AFTER SURG (MISCELLANEOUS) ×2 IMPLANT
SYR 3ML LL SCALE MARK (SYRINGE) ×2 IMPLANT
SYR TB 1ML LUER SLIP (SYRINGE) ×2 IMPLANT
WATER STERILE IRR 250ML POUR (IV SOLUTION) ×2 IMPLANT
WIPE NON LINTING 3.25X3.25 (MISCELLANEOUS) ×2 IMPLANT

## 2020-10-29 NOTE — H&P (Signed)
[facility]   Primary Care Physician:  Erasmo Downer, MD Ophthalmologist: Dr. Druscilla Brownie  Pre-Procedure History & Physical: HPI:  Melissa Barry is a 75 y.o. female here for cataract surgery.   Past Medical History:  Diagnosis Date   Medical history non-contributory     Past Surgical History:  Procedure Laterality Date   BREAST BIOPSY Left years ago   Dr. Lemar Barry    COLONOSCOPY WITH PROPOFOL N/A 12/17/2015   Procedure: COLONOSCOPY WITH PROPOFOL;  Surgeon: Midge Minium, MD;  Location: [facility] ENDOSCOPY;  Service: Endoscopy;  Laterality: N/A;   DILATION AND CURETTAGE OF UTERUS     TONSILLECTOMY AND ADENOIDECTOMY      Prior to Admission medications   Medication Sig Start Date End Date Taking? Authorizing Provider  ibuprofen (ADVIL) 200 MG tablet Take 200 mg by mouth every 6 (six) hours as needed.   Yes [provider]  Multiple Vitamins-Minerals (PRESERVISION AREDS PO) Take by mouth.   Yes [provider]    Allergies as of 09/27/2020 - Review Complete 05/02/2020  Allergen Reaction Noted   Doxycycline Other (See Comments) 10/11/2014   Penicillins Rash 10/11/2014   Sulfa antibiotics Swelling and Rash 10/11/2014    Family History  Problem Relation Age of Onset   Ovarian cancer Mother    Heart disease Father    Lung cancer Father    Thyroid disease Father    Congestive Heart Failure Father    Hepatitis Sister    Celiac disease Sister    Migraines Daughter    Migraines Son    Sarcoidosis Son    Parkinson's disease Maternal Grandfather    Stroke Paternal Grandmother    Lung cancer Paternal Grandfather     Social History   Socioeconomic History   Marital status: Married    Spouse name: Not on file   Number of children: 2   Years of education: Not on file   Highest education level: Some college, no degree  Occupational History   Occupation: retired  Tobacco Use   Smoking status: Former    Years: 2.00    Pack years: 0.00    Types:  Cigarettes   Smokeless tobacco: Never   Tobacco comments:    only smoked in Therapist, art Use: Never used  Substance and Sexual Activity   Alcohol use: Yes    Alcohol/week: 0.0 - 4.0 standard drinks    Comment: a couple drinks a month   Drug use: No   Sexual activity: Not on file  Other Topics Concern   Not on file  Social History Narrative   Not on file   Social Determinants of Health   Financial Resource Strain: Low Risk    Difficulty of Paying Living Expenses: Not hard at all  Food Insecurity: No Food Insecurity   Worried About Programme researcher, broadcasting/film/video in the Last Year: Never true   Ran Out of Food in the Last Year: Never true  Transportation Needs: No Transportation Needs   Lack of Transportation (Medical): No   Lack of Transportation (Non-Medical): No  Physical Activity: Inactive   Days of Exercise per Week: 0 days   Minutes of Exercise per Session: 0 min  Stress: No Stress Concern Present   Feeling of Stress : Not at all  Social Connections: Moderately Isolated   Frequency of Communication with Friends and Family: More than three times a week   Frequency of Social Gatherings with Friends and Family: Three times  a week   Attends Religious Services: Never   Active Member of Clubs or Organizations: No   Attends Banker Meetings: Never   Marital Status: Married  Catering manager Violence: Not At Risk   Fear of Current or Ex-Partner: No   Emotionally Abused: No   Physically Abused: No   Sexually Abused: No    Review of Systems: See HPI, otherwise negative ROS  Physical Exam: BP (!) 174/67   Pulse 67   Temp 98.4 F (36.9 C) (Temporal)   Resp 16   Ht 5\' 5"  (1.651 m)   Wt 91.6 kg   SpO2 95%   BMI 33.61 kg/m  General:   Alert, cooperative in NAD Head:  Normocephalic and atraumatic. Respiratory:  Normal work of breathing. Cardiovascular:  RRR  Impression/Plan: is here for cataract surgery.  Risks, benefits,  limitations, and alternatives regarding cataract surgery have been reviewed with the patient.  Questions have been answered.  All parties agreeable.   [name], MD  10/29/2020, 7:48 AM

## 2020-10-29 NOTE — Op Note (Signed)
PREOPERATIVE DIAGNOSIS:  Nuclear sclerotic cataract of the right eye.   POSTOPERATIVE DIAGNOSIS:  Cataract   OPERATIVE PROCEDURE:ORPROCALL@   SURGEON:  Galen Manila, MD.   ANESTHESIA:  Anesthesiologist: Reed Breech, MD CRNA: Michaele Offer, CRNA  1.      Managed anesthesia care. 2.      0.51ml of Shugarcaine was instilled in the eye following the paracentesis.   COMPLICATIONS:  None.   TECHNIQUE:   Stop and chop   DESCRIPTION OF PROCEDURE:  The patient was examined and consented in the preoperative holding area where the aforementioned topical anesthesia was applied to the right eye and then brought back to the Operating Room where the right eye was prepped and draped in the usual sterile ophthalmic fashion and a lid speculum was placed. A paracentesis was created with the side port blade and the anterior chamber was filled with viscoelastic. A near clear corneal incision was performed with the steel keratome. A continuous curvilinear capsulorrhexis was performed with a cystotome followed by the capsulorrhexis forceps. Hydrodissection and hydrodelineation were carried out with BSS on a blunt cannula. The lens was removed in a stop and chop  technique and the remaining cortical material was removed with the irrigation-aspiration handpiece. The capsular bag was inflated with viscoelastic and the Technis ZCB00  lens was placed in the capsular bag without complication. The remaining viscoelastic was removed from the eye with the irrigation-aspiration handpiece. The wounds were hydrated. The anterior chamber was flushed with BSS and the eye was inflated to physiologic pressure. 0.42ml of Vigamox was placed in the anterior chamber. The wounds were found to be water tight. The eye was dressed with Combigan. The patient was given protective glasses to wear throughout the day and a shield with which to sleep tonight. The patient was also given drops with which to begin a drop regimen today and will  follow-up with me in one day. Implant Name Type Inv. Item Serial No. Manufacturer Lot No. LRB No. Used Action  LENS IOL ACRYSOF VIVITY 18.5 - [id number]  LENS IOL ACRYSOF VIVITY 18.5 [id number] ALCON  Right 1 Implanted   Procedure(s) with comments: CATARACT EXTRACTION PHACO AND INTRAOCULAR LENS PLACEMENT (IOC) RIGHT VIVITY LENS 8.96 01:00.6 (Right) - [id number]  Electronically signed: Galen Manila 10/29/2020 8:18 AM

## 2020-10-29 NOTE — Transfer of Care (Signed)
Immediate Anesthesia Transfer of Care Note  Patient: Melissa Barry [facility]  Procedure(s) Performed: CATARACT EXTRACTION PHACO AND INTRAOCULAR LENS PLACEMENT (IOC) RIGHT VIVITY LENS 8.96 01:00.6 (Right: Eye)  Patient Location: PACU  Anesthesia Type: MAC  Level of Consciousness: awake, alert  and patient cooperative  Airway and Oxygen Therapy: Patient Spontanous Breathing and Patient connected to supplemental oxygen  Post-op Assessment: Post-op Vital signs reviewed, Patient's Cardiovascular Status Stable, Respiratory Function Stable, Patent Airway and No signs of Nausea or vomiting  Post-op Vital Signs: Reviewed and stable  Complications: No notable events documented.

## 2020-10-29 NOTE — Anesthesia Postprocedure Evaluation (Signed)
Anesthesia Post Note  Patient: Melissa Barry [facility]  Procedure(s) Performed: CATARACT EXTRACTION PHACO AND INTRAOCULAR LENS PLACEMENT (IOC) RIGHT VIVITY LENS 8.96 01:00.6 (Right: Eye)     Patient location during evaluation: PACU Anesthesia Type: MAC Level of consciousness: awake and alert, oriented and patient cooperative Pain management: pain level controlled Vital Signs Assessment: post-procedure vital signs reviewed and stable Respiratory status: spontaneous breathing, nonlabored ventilation and respiratory function stable Cardiovascular status: blood pressure returned to baseline and stable Postop Assessment: adequate PO intake Anesthetic complications: no   No notable events documented.  Darrin Nipper

## 2020-10-31 ENCOUNTER — Encounter: Payer: Self-pay | Admitting: Family Medicine

## 2020-10-31 ENCOUNTER — Other Ambulatory Visit: Payer: Self-pay

## 2020-10-31 ENCOUNTER — Ambulatory Visit: Payer: Medicare PPO | Admitting: Family Medicine

## 2020-10-31 VITALS — BP 143/80 | HR 72 | Temp 98.2°F | Ht 65.0 in | Wt 202.0 lb

## 2020-10-31 DIAGNOSIS — E669 Obesity, unspecified: Secondary | ICD-10-CM | POA: Diagnosis not present

## 2020-10-31 DIAGNOSIS — G6289 Other specified polyneuropathies: Secondary | ICD-10-CM | POA: Diagnosis not present

## 2020-10-31 DIAGNOSIS — M7661 Achilles tendinitis, right leg: Secondary | ICD-10-CM

## 2020-10-31 DIAGNOSIS — I10 Essential (primary) hypertension: Secondary | ICD-10-CM | POA: Diagnosis not present

## 2020-10-31 DIAGNOSIS — M7662 Achilles tendinitis, left leg: Secondary | ICD-10-CM | POA: Diagnosis not present

## 2020-10-31 DIAGNOSIS — E7800 Pure hypercholesterolemia, unspecified: Secondary | ICD-10-CM | POA: Diagnosis not present

## 2020-10-31 DIAGNOSIS — Z6833 Body mass index (BMI) 33.0-33.9, adult: Secondary | ICD-10-CM | POA: Diagnosis not present

## 2020-10-31 HISTORY — DX: Essential (primary) hypertension: I10

## 2020-10-31 NOTE — Assessment & Plan Note (Addendum)
Pt. counseled on importance of lifestyle changes, including diet and exercise

## 2020-10-31 NOTE — Progress Notes (Signed)
Established patient visit   Patient: Melissa Barry   DOB: [birth year]   75 y.o. Female  MRN: [id number] Visit Date: 10/31/2020  Today's healthcare provider: Shirlee Latch, MD   Chief Complaint  Patient presents with   Hypertension   Thyroid Problem   Hyperlipidemia   Subjective    HPI   Hypertension - Stable, slightly elevated systolic BP over multiple visits - Pt. is uninterested in starting new medications at this time  Hyperlipidemia  - Previous lipid panel in Jan 22, due for repeat labs today - Pt. is uninterested in starting new medications at this time  Tendonitis - Chronic burning pain in bil heels, worse at night, sometimes improves w/ activity - Previously seen by [facility] who recommended PT, but pt. declined PT   Cataracts - s/p R cataract surgery on 10/29/20 - pt. denies post-op pain/complications - due for L cataract surgery on 11/12/20 - Pt. notes significantly improved vision in R eye    Medications: Outpatient Medications Prior to Visit  Medication Sig   ibuprofen (ADVIL) 200 MG tablet Take 200 mg by mouth every 6 (six) hours as needed.   Multiple Vitamins-Minerals (PRESERVISION AREDS PO) Take by mouth.   No facility-administered medications prior to visit.    Review of Systems  Constitutional:  Positive for activity change.  HENT: Negative.    Eyes: Negative.  Negative for discharge.  Respiratory: Negative.  Negative for chest tightness and shortness of breath.   Cardiovascular:  Positive for leg swelling. Negative for chest pain.  Gastrointestinal: Negative.   Endocrine: Negative.   Musculoskeletal: Negative.   All other systems reviewed and are negative.     Objective    BP (!) 143/80   Pulse 72   Temp 98.2 F (36.8 C) (Oral)   Ht 5\' 5"  (1.651 m)   Wt 202 lb (91.6 kg)   SpO2 98%   BMI 33.61 kg/m     Physical Exam Constitutional:      General: She is not in acute distress. HENT:     Head: Normocephalic and  atraumatic.     Right Ear: External ear normal.     Left Ear: External ear normal.  Eyes:     Conjunctiva/sclera: Conjunctivae normal.  Cardiovascular:     Rate and Rhythm: Normal rate and regular rhythm.     Pulses: Normal pulses.     Heart sounds: Normal heart sounds.  Pulmonary:     Effort: Pulmonary effort is normal.     Breath sounds: Normal breath sounds.  Abdominal:     General: Abdomen is flat. Bowel sounds are normal. There is no distension.     Palpations: Abdomen is soft. There is no mass.  Skin:    General: Skin is warm and dry.  Neurological:     Mental Status: She is alert.     No results found for any visits on 10/31/20.  Assessment & Plan     Problem List Items Addressed This Visit       Cardiovascular and Mediastinum   Primary hypertension - Primary    Pt. counseled on importance of lifestyle changes, including low sodium diet and exercise       Relevant Orders   Comprehensive metabolic panel     Nervous and Auditory   Peripheral neuropathy    Mild, stable; no medications desired at this time. Continue to monitor.         Musculoskeletal and Integument   Achilles  tendinitis of both lower extremities    Pt. 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 counseled on importance of lifestyle changes, including diet and exercise         Return in about 6 months (around 05/03/2021) for CPE, AWV.     Queen Blossom, MS3  Patient seen along with MS3 student Queen Blossom. I personally evaluated this patient along with the student, and verified all aspects of the history, physical exam, and medical decision making as documented by the student. I agree with the student's  documentation and have made all necessary edits.  Enedina Pair, Marzella Schlein, MD, MPH [facility]

## 2020-10-31 NOTE — Assessment & Plan Note (Addendum)
Pt. counseled on "heel lift" exercises and stretches for symptom management

## 2020-10-31 NOTE — Assessment & Plan Note (Signed)
Pt. counseled on importance of lifestyle changes, including low sodium diet and exercise

## 2020-10-31 NOTE — Assessment & Plan Note (Signed)
Mild, stable; no medications desired at this time. Continue to monitor.

## 2020-10-31 NOTE — Assessment & Plan Note (Signed)
Stable, will continue to monitor.

## 2020-11-01 ENCOUNTER — Encounter: Payer: Self-pay | Admitting: Ophthalmology

## 2020-11-01 DIAGNOSIS — I10 Essential (primary) hypertension: Secondary | ICD-10-CM | POA: Diagnosis not present

## 2020-11-01 DIAGNOSIS — E7800 Pure hypercholesterolemia, unspecified: Secondary | ICD-10-CM | POA: Diagnosis not present

## 2020-11-02 LAB — LIPID PANEL
Chol/HDL Ratio: 4 ratio (ref 0.0–4.4)
Cholesterol, Total: 291 mg/dL — ABNORMAL HIGH (ref 100–199)
HDL: 72 mg/dL (ref >39)
LDL Chol Calc (NIH): 194 mg/dL — ABNORMAL HIGH (ref 0–99)
Triglycerides: 142 mg/dL (ref 0–149)
VLDL Cholesterol Cal: 25 mg/dL (ref 5–40)

## 2020-11-02 LAB — COMPREHENSIVE METABOLIC PANEL
ALT: 14 IU/L (ref 0–32)
AST: 18 IU/L (ref 0–40)
Albumin/Globulin Ratio: 2.2 (ref 1.2–2.2)
Albumin: 4.6 g/dL (ref 3.7–4.7)
Alkaline Phosphatase: 85 IU/L (ref 44–121)
BUN/Creatinine Ratio: 18 (ref 12–28)
BUN: 14 mg/dL (ref 8–27)
Bilirubin Total: 0.7 mg/dL (ref 0.0–1.2)
CO2: 24 mmol/L (ref 20–29)
Calcium: 10.1 mg/dL (ref 8.7–10.3)
Chloride: 102 mmol/L (ref 96–106)
Creatinine, Ser: 0.79 mg/dL (ref 0.57–1.00)
Globulin, Total: 2.1 g/dL (ref 1.5–4.5)
Glucose: 97 mg/dL (ref 65–99)
Potassium: 5.9 mmol/L — ABNORMAL HIGH (ref 3.5–5.2)
Sodium: 143 mmol/L (ref 134–144)
Total Protein: 6.7 g/dL (ref 6.0–8.5)
eGFR: 79 mL/min/{1.73_m2} (ref >59)

## 2020-11-04 ENCOUNTER — Telehealth: Payer: Self-pay

## 2020-11-04 DIAGNOSIS — I10 Essential (primary) hypertension: Secondary | ICD-10-CM

## 2020-11-04 NOTE — Telephone Encounter (Signed)
-----   Message from Erasmo Downer, MD sent at 11/04/2020  8:06 AM EDT ----- Normal labs, except for slightly elevated potassium and high cholesterol.  The 10-year ASCVD (heart disease and stroke) risk score Denman George DC Jr., et al., 2013) is: 16.7%, which is high. Consider statin to lower this risk. Hydrate well and cut back on potassium-containing foods. Recheck BMP in 1 month

## 2020-11-06 DIAGNOSIS — H2512 Age-related nuclear cataract, left eye: Secondary | ICD-10-CM | POA: Diagnosis not present

## 2020-11-11 NOTE — Discharge Instructions (Signed)

## 2020-11-12 ENCOUNTER — Encounter
Admission: RE | Disposition: A | Discharge status: Home / Self Care | Payer: Self-pay | Source: Home / Self Care | Attending: Ophthalmology

## 2020-11-12 ENCOUNTER — Encounter: Payer: Self-pay | Admitting: Ophthalmology

## 2020-11-12 ENCOUNTER — Ambulatory Visit
Admission: RE | Admit: 2020-11-12 | Discharge: 2020-11-12 | Disposition: A | Discharge status: Home / Self Care | Payer: Medicare PPO | Attending: Ophthalmology | Admitting: Ophthalmology

## 2020-11-12 ENCOUNTER — Other Ambulatory Visit: Payer: Self-pay

## 2020-11-12 ENCOUNTER — Ambulatory Visit: Payer: Medicare PPO | Admitting: Anesthesiology

## 2020-11-12 DIAGNOSIS — Z9841 Cataract extraction status, right eye: Secondary | ICD-10-CM | POA: Diagnosis not present

## 2020-11-12 DIAGNOSIS — Z87891 Personal history of nicotine dependence: Secondary | ICD-10-CM | POA: Insufficient documentation

## 2020-11-12 DIAGNOSIS — Z961 Presence of intraocular lens: Secondary | ICD-10-CM | POA: Diagnosis not present

## 2020-11-12 DIAGNOSIS — Z880 Allergy status to penicillin: Secondary | ICD-10-CM | POA: Diagnosis not present

## 2020-11-12 DIAGNOSIS — Z882 Allergy status to sulfonamides status: Secondary | ICD-10-CM | POA: Diagnosis not present

## 2020-11-12 DIAGNOSIS — Z881 Allergy status to other antibiotic agents status: Secondary | ICD-10-CM | POA: Diagnosis not present

## 2020-11-12 DIAGNOSIS — H25812 Combined forms of age-related cataract, left eye: Secondary | ICD-10-CM | POA: Diagnosis not present

## 2020-11-12 DIAGNOSIS — H2512 Age-related nuclear cataract, left eye: Secondary | ICD-10-CM | POA: Insufficient documentation

## 2020-11-12 HISTORY — PX: CATARACT EXTRACTION W/PHACO: SHX586

## 2020-11-12 SURGERY — PHACOEMULSIFICATION, CATARACT, WITH IOL INSERTION
Anesthesia: Monitor Anesthesia Care | Site: Eye | Laterality: Left

## 2020-11-12 MED ORDER — MIDAZOLAM HCL 2 MG/2ML IJ SOLN
INTRAMUSCULAR | Status: DC | PRN
  Administered 2020-11-12: 1 mg via INTRAVENOUS

## 2020-11-12 MED ORDER — CYCLOPENTOLATE HCL 2 % OP SOLN
1.0000 [drp] | OPHTHALMIC | Status: DC | PRN
  Administered 2020-11-12 (×3): 1 [drp] via OPHTHALMIC

## 2020-11-12 MED ORDER — PHENYLEPHRINE HCL 10 % OP SOLN
1.0000 [drp] | OPHTHALMIC | Status: DC | PRN
  Administered 2020-11-12 (×3): 1 [drp] via OPHTHALMIC

## 2020-11-12 MED ORDER — SIGHTPATH DOSE#1 NA CHONDROIT SULF-NA HYALURON 40-17 MG/ML IO SOLN
INTRAOCULAR | Status: DC | PRN
  Administered 2020-11-12: 1 mL via INTRAOCULAR

## 2020-11-12 MED ORDER — SIGHTPATH DOSE#1 BSS IO SOLN
INTRAOCULAR | Status: DC | PRN
  Administered 2020-11-12: 47 mL via OPHTHALMIC

## 2020-11-12 MED ORDER — BRIMONIDINE TARTRATE-TIMOLOL 0.2-0.5 % OP SOLN
OPHTHALMIC | Status: DC | PRN
  Administered 2020-11-12: 1 [drp] via OPHTHALMIC

## 2020-11-12 MED ORDER — TETRACAINE HCL 0.5 % OP SOLN
1.0000 [drp] | OPHTHALMIC | Status: DC | PRN
  Administered 2020-11-12 (×3): 1 [drp] via OPHTHALMIC

## 2020-11-12 MED ORDER — DEXAMETHASONE 0.4 MG OP INST
VAGINAL_INSERT | OPHTHALMIC | Status: DC | PRN
  Administered 2020-11-12: 0.4 mg via OPHTHALMIC

## 2020-11-12 MED ORDER — SIGHTPATH DOSE#1 BSS IO SOLN
INTRAOCULAR | Status: DC | PRN
  Administered 2020-11-12: 15 mL via INTRAOCULAR

## 2020-11-12 MED ORDER — FENTANYL CITRATE (PF) 100 MCG/2ML IJ SOLN
INTRAMUSCULAR | Status: DC | PRN
  Administered 2020-11-12: 50 ug via INTRAVENOUS

## 2020-11-12 MED ORDER — MOXIFLOXACIN HCL 0.5 % OP SOLN
OPHTHALMIC | Status: DC | PRN
  Administered 2020-11-12: 0.2 mL via OPHTHALMIC

## 2020-11-12 MED ORDER — SIGHTPATH DOSE#1 BSS IO SOLN
INTRAOCULAR | Status: DC | PRN
  Administered 2020-11-12: 2 mL

## 2020-11-12 SURGICAL SUPPLY — 16 items
CANNULA ANT/CHMB 27GA (MISCELLANEOUS) ×4 IMPLANT
GLOVE SURG ENC TEXT LTX SZ8 (GLOVE) ×2 IMPLANT
GLOVE SURG TRIUMPH 8.0 PF LTX (GLOVE) ×2 IMPLANT
GOWN STRL REUS W/ TWL LRG LVL3 (GOWN DISPOSABLE) ×2 IMPLANT
GOWN STRL REUS W/TWL LRG LVL3 (GOWN DISPOSABLE) ×4
LENS IOL ACRSF IQ VT 15 18.5 ×1 IMPLANT
LENS IOL ACRYSOF VIVITY 18.5 ×2 IMPLANT
LENS IOL VIVITY 015 18.5 ×1 IMPLANT
MARKER SKIN DUAL TIP RULER LAB (MISCELLANEOUS) ×2 IMPLANT
NEEDLE FILTER BLUNT 18X 1/2SAF (NEEDLE) ×1
NEEDLE FILTER BLUNT 18X1 1/2 (NEEDLE) ×1 IMPLANT
PACK EYE AFTER SURG (MISCELLANEOUS) ×2 IMPLANT
SYR 3ML LL SCALE MARK (SYRINGE) ×2 IMPLANT
SYR TB 1ML LUER SLIP (SYRINGE) ×2 IMPLANT
WATER STERILE IRR 250ML POUR (IV SOLUTION) ×2 IMPLANT
WIPE NON LINTING 3.25X3.25 (MISCELLANEOUS) ×2 IMPLANT

## 2020-11-12 NOTE — Anesthesia Postprocedure Evaluation (Signed)
Anesthesia Post Note  Patient: Melissa Barry [facility]  Procedure(s) Performed: CATARACT EXTRACTION PHACO AND INTRAOCULAR LENS PLACEMENT (IOC) LEFT VIVITY LENS 4.73 00:31.0 (Left: Eye)     Patient location during evaluation: PACU Anesthesia Type: MAC Level of consciousness: awake Pain management: pain level controlled Vital Signs Assessment: post-procedure vital signs reviewed and stable Respiratory status: respiratory function stable Cardiovascular status: stable Postop Assessment: no apparent nausea or vomiting Anesthetic complications: no   No notable events documented.  Veda Canning

## 2020-11-12 NOTE — Transfer of Care (Signed)
Immediate Anesthesia Transfer of Care Note  Patient: Melissa Barry [facility]  Procedure(s) Performed: CATARACT EXTRACTION PHACO AND INTRAOCULAR LENS PLACEMENT (IOC) LEFT VIVITY LENS 4.73 00:31.0 (Left: Eye)  Patient Location: PACU  Anesthesia Type: MAC  Level of Consciousness: awake, alert  and patient cooperative  Airway and Oxygen Therapy: Patient Spontanous Breathing and Patient connected to supplemental oxygen  Post-op Assessment: Post-op Vital signs reviewed, Patient's Cardiovascular Status Stable, Respiratory Function Stable, Patent Airway and No signs of Nausea or vomiting  Post-op Vital Signs: Reviewed and stable  Complications: No notable events documented.

## 2020-11-12 NOTE — Op Note (Signed)
PREOPERATIVE DIAGNOSIS:  Nuclear sclerotic cataract of the left eye.   POSTOPERATIVE DIAGNOSIS:  Nuclear sclerotic cataract of the left eye.   OPERATIVE PROCEDURE:ORPROCALL@   SURGEON:  Galen Manila, MD.   ANESTHESIA:  Anesthesiologist: Jola Babinski, MD CRNA: Lily Kocher, CRNA  1.      Managed anesthesia care. 2.     0.55ml of Shugarcaine was instilled following the paracentesis   COMPLICATIONS:  None.   TECHNIQUE:   Stop and chop   DESCRIPTION OF PROCEDURE:  The patient was examined and consented in the preoperative holding area where the aforementioned topical anesthesia was applied to the left eye and then brought back to the Operating Room where the left eye was prepped and draped in the usual sterile ophthalmic fashion and a lid speculum was placed. A paracentesis was created with the side port blade and the anterior chamber was filled with viscoelastic. A near clear corneal incision was performed with the steel keratome. A continuous curvilinear capsulorrhexis was performed with a cystotome followed by the capsulorrhexis forceps. Hydrodissection and hydrodelineation were carried out with BSS on a blunt cannula. The lens was removed in a stop and chop  technique and the remaining cortical material was removed with the irrigation-aspiration handpiece. The capsular bag was inflated with viscoelastic and the Technis ZCB00 lens was placed in the capsular bag without complication. The remaining viscoelastic was removed from the eye with the irrigation-aspiration handpiece. The wounds were hydrated. The anterior chamber was flushed with BSS and the eye was inflated to physiologic pressure. 0.18ml Vigamox was placed in the anterior chamber. The wounds were found to be water tight. The eye was dressed with Combigan. The patient was given protective glasses to wear throughout the day and a shield with which to sleep tonight. The patient was also given drops with which to begin a drop regimen  today and will follow-up with me in one day. Implant Name Type Inv. Item Serial No. Manufacturer Lot No. LRB No. Used Action  LENS IOL ACRYSOF VIVITY 18.5 - [id number]  LENS IOL ACRYSOF VIVITY 18.5 [id number] ALCON  Left 1 Implanted    Procedure(s) with comments: CATARACT EXTRACTION PHACO AND INTRAOCULAR LENS PLACEMENT (IOC) LEFT VIVITY LENS 4.73 00:31.0 (Left) - [id number]  Electronically signed: Galen Manila 11/12/2020 9:49 AM

## 2020-11-12 NOTE — Anesthesia Preprocedure Evaluation (Signed)
Anesthesia Evaluation  Patient identified by MRN, date of birth, ID band Patient awake    Reviewed: Allergy & Precautions, NPO status   History of Anesthesia Complications Negative for: history of anesthetic complications  Airway Mallampati: IV   Neck ROM: Full    Dental   Upper bridge:   Pulmonary former smoker,    breath sounds clear to auscultation       Cardiovascular Exercise Tolerance: Good hypertension, negative cardio ROS   Rhythm:Regular Rate:Normal     Neuro/Psych    GI/Hepatic negative GI ROS,   Endo/Other  Obesity - BMI > 30  Renal/GU      Musculoskeletal  (+) Arthritis ,   Abdominal   Peds  Hematology   Anesthesia Other Findings   Reproductive/Obstetrics                             Anesthesia Physical  Anesthesia Plan  ASA: 2  Anesthesia Plan: MAC   Post-op Pain Management:    Induction: Intravenous  PONV Risk Score and Plan: 2 and TIVA, Midazolam and Treatment may vary due to age or medical condition  Airway Management Planned: Nasal Cannula  Additional Equipment:   Intra-op Plan:   Post-operative Plan:   Informed Consent: I have reviewed the patients History and Physical, chart, labs and discussed the procedure including the risks, benefits and alternatives for the proposed anesthesia with the patient or authorized representative who has indicated his/her understanding and acceptance.       Plan Discussed with: CRNA  Anesthesia Plan Comments:         Anesthesia Quick Evaluation

## 2020-11-12 NOTE — Anesthesia Procedure Notes (Signed)
Procedure Name: MAC Date/Time: 11/12/2020 9:31 AM Performed by: Dionne Bucy, CRNA Pre-anesthesia Checklist: Patient identified, Emergency Drugs available, Suction available, Patient being monitored and Timeout performed Patient Re-evaluated:Patient Re-evaluated prior to induction Oxygen Delivery Method: Nasal cannula Placement Confirmation: positive ETCO2

## 2020-11-12 NOTE — H&P (Signed)
[facility]   Primary Care Physician:  Erasmo Downer, MD Ophthalmologist: Dr. Druscilla Brownie  Pre-Procedure History & Physical: HPI:  Melissa Barry is a 75 y.o. female here for cataract surgery.   Past Medical History:  Diagnosis Date   Medical history non-contributory    Primary hypertension 10/31/2020    Past Surgical History:  Procedure Laterality Date   BREAST BIOPSY Left years ago   Dr. Lemar Livings    CATARACT EXTRACTION W/PHACO Right 10/29/2020   Procedure: CATARACT EXTRACTION PHACO AND INTRAOCULAR LENS PLACEMENT (IOC) RIGHT VIVITY LENS 8.96 01:00.6;  Surgeon: Galen Manila, MD;  Location: [facility];  Service: Ophthalmology;  Laterality: Right;  DEXTENZA   COLONOSCOPY WITH PROPOFOL N/A 12/17/2015   Procedure: COLONOSCOPY WITH PROPOFOL;  Surgeon: Midge Minium, MD;  Location: [facility];  Service: Endoscopy;  Laterality: N/A;   DILATION AND CURETTAGE OF UTERUS     EYE SURGERY Right    cataract   TONSILLECTOMY AND ADENOIDECTOMY      Prior to Admission medications   Medication Sig Start Date End Date Taking? Authorizing Provider  ibuprofen (ADVIL) 200 MG tablet Take 200 mg by mouth every 6 (six) hours as needed.   Yes [provider]  Multiple Vitamins-Minerals (PRESERVISION AREDS PO) Take by mouth.   Yes [provider]    Allergies as of 09/27/2020 - Review Complete 05/02/2020  Allergen Reaction Noted   Doxycycline Other (See Comments) 10/11/2014   Penicillins Rash 10/11/2014   Sulfa antibiotics Swelling and Rash 10/11/2014    Family History  Problem Relation Age of Onset   Ovarian cancer Mother    Heart disease Father    Lung cancer Father    Thyroid disease Father    Congestive Heart Failure Father    Hepatitis Sister    Celiac disease Sister    Migraines Daughter    Migraines Son    Sarcoidosis Son    Parkinson's disease Maternal Grandfather    Stroke Paternal Grandmother    Lung cancer Paternal Grandfather      Social History   Socioeconomic History   Marital status: Married    Spouse name: Not on file   Number of children: 2   Years of education: Not on file   Highest education level: Some college, no degree  Occupational History   Occupation: retired  Tobacco Use   Smoking status: Former    Years: 2.00    Types: Cigarettes   Smokeless tobacco: Never   Tobacco comments:    only smoked in Therapist, art Use: Never used  Substance and Sexual Activity   Alcohol use: Yes    Alcohol/week: 0.0 - 4.0 standard drinks    Comment: a couple drinks a month   Drug use: No   Sexual activity: Not on file  Other Topics Concern   Not on file  Social History Narrative   Not on file   Social Determinants of Health   Financial Resource Strain: Low Risk    Difficulty of Paying Living Expenses: Not hard at all  Food Insecurity: No Food Insecurity   Worried About Programme researcher, broadcasting/film/video in the Last Year: Never true   Ran Out of Food in the Last Year: Never true  Transportation Needs: No Transportation Needs   Lack of Transportation (Medical): No   Lack of Transportation (Non-Medical): No  Physical Activity: Inactive   Days of Exercise per Week: 0 days   Minutes of Exercise per Session:  0 min  Stress: No Stress Concern Present   Feeling of Stress : Not at all  Social Connections: Moderately Isolated   Frequency of Communication with Friends and Family: More than three times a week   Frequency of Social Gatherings with Friends and Family: Three times a week   Attends Religious Services: Never   Active Member of Clubs or Organizations: No   Attends Banker Meetings: Never   Marital Status: Married  Catering manager Violence: Not At Risk   Fear of Current or Ex-Partner: No   Emotionally Abused: No   Physically Abused: No   Sexually Abused: No    Review of Systems: See HPI, otherwise negative ROS  Physical Exam: BP (!) 147/78   Pulse 66   Temp (!) 97 F  (36.1 C) (Temporal)   Resp 18   Ht 5\' 5"  (1.651 m)   Wt 89.4 kg   SpO2 96%   BMI 32.78 kg/m  General:   Alert, cooperative in NAD Head:  Normocephalic and atraumatic. Respiratory:  Normal work of breathing. Cardiovascular:  RRR  Impression/Plan: is here for cataract surgery.  Risks, benefits, limitations, and alternatives regarding cataract surgery have been reviewed with the patient.  Questions have been answered.  All parties agreeable.   [name], MD  11/12/2020, 9:22 AM

## 2020-11-13 ENCOUNTER — Encounter: Payer: Self-pay | Admitting: Ophthalmology

## 2020-12-05 DIAGNOSIS — I10 Essential (primary) hypertension: Secondary | ICD-10-CM | POA: Diagnosis not present

## 2020-12-06 LAB — BASIC METABOLIC PANEL
BUN/Creatinine Ratio: 25 (ref 12–28)
BUN: 19 mg/dL (ref 8–27)
CO2: 22 mmol/L (ref 20–29)
Calcium: 9.5 mg/dL (ref 8.7–10.3)
Chloride: 105 mmol/L (ref 96–106)
Creatinine, Ser: 0.77 mg/dL (ref 0.57–1.00)
Glucose: 88 mg/dL (ref 65–99)
Potassium: 4.6 mmol/L (ref 3.5–5.2)
Sodium: 142 mmol/L (ref 134–144)
eGFR: 81 mL/min/{1.73_m2} (ref >59)

## 2021-01-14 DIAGNOSIS — E7800 Pure hypercholesterolemia, unspecified: Secondary | ICD-10-CM | POA: Diagnosis not present

## 2021-01-14 DIAGNOSIS — I4891 Unspecified atrial fibrillation: Secondary | ICD-10-CM | POA: Diagnosis not present

## 2021-01-14 DIAGNOSIS — I482 Chronic atrial fibrillation, unspecified: Secondary | ICD-10-CM | POA: Diagnosis not present

## 2021-01-14 DIAGNOSIS — I10 Essential (primary) hypertension: Secondary | ICD-10-CM | POA: Diagnosis not present

## 2021-01-14 DIAGNOSIS — R1010 Upper abdominal pain, unspecified: Secondary | ICD-10-CM | POA: Diagnosis not present

## 2021-02-03 DIAGNOSIS — I4811 Longstanding persistent atrial fibrillation: Secondary | ICD-10-CM | POA: Diagnosis not present

## 2021-02-10 ENCOUNTER — Telehealth: Payer: Self-pay | Admitting: Family Medicine

## 2021-02-10 NOTE — Telephone Encounter (Signed)
Patient was seen at [facility] on 01/14/2021 and was dx with atrial fibrillation with rvr. Patient had to wear a heart monitor, a EKG and labs were done. [facility] informed patient results would be sent to PCP. Patient would like all results sent to [facility] A. Lady Gary, MD, cardiology office. Patient would like a follow up call to confirm heart monitor results, EKG results, and lab results were received.

## 2021-02-11 NOTE — Telephone Encounter (Signed)
Patient advised. Patient reports she did sign a release of information at the ER. Patient reports she will call medical records at [facility] care.

## 2021-02-11 NOTE — Telephone Encounter (Signed)
No we haven't received the records

## 2021-02-11 NOTE — Telephone Encounter (Signed)
[facility] and medical records reports they did fax notes, however to a different practice. They reports mrs. Rewis did not sigh a ROI for notes to be faxed to [place].

## 2021-02-13 DIAGNOSIS — E7849 Other hyperlipidemia: Secondary | ICD-10-CM | POA: Diagnosis not present

## 2021-02-13 DIAGNOSIS — I479 Paroxysmal tachycardia, unspecified: Secondary | ICD-10-CM | POA: Diagnosis not present

## 2021-02-13 DIAGNOSIS — I480 Paroxysmal atrial fibrillation: Secondary | ICD-10-CM | POA: Diagnosis not present

## 2021-03-14 DIAGNOSIS — R002 Palpitations: Secondary | ICD-10-CM | POA: Diagnosis not present

## 2021-05-07 ENCOUNTER — Encounter: Payer: Self-pay | Admitting: Emergency Medicine

## 2021-05-07 ENCOUNTER — Other Ambulatory Visit: Payer: Self-pay

## 2021-05-07 ENCOUNTER — Emergency Department
Admission: EM | Admit: 2021-05-07 | Discharge: 2021-05-08 | Disposition: A | Discharge status: Home / Self Care | Payer: Medicare PPO | Attending: Emergency Medicine | Admitting: Emergency Medicine

## 2021-05-07 ENCOUNTER — Emergency Department: Payer: Medicare PPO

## 2021-05-07 DIAGNOSIS — Z20822 Contact with and (suspected) exposure to covid-19: Secondary | ICD-10-CM | POA: Insufficient documentation

## 2021-05-07 DIAGNOSIS — R000 Tachycardia, unspecified: Secondary | ICD-10-CM | POA: Diagnosis present

## 2021-05-07 DIAGNOSIS — I499 Cardiac arrhythmia, unspecified: Secondary | ICD-10-CM | POA: Diagnosis not present

## 2021-05-07 DIAGNOSIS — I4891 Unspecified atrial fibrillation: Secondary | ICD-10-CM | POA: Diagnosis not present

## 2021-05-07 DIAGNOSIS — R079 Chest pain, unspecified: Secondary | ICD-10-CM

## 2021-05-07 NOTE — ED Triage Notes (Signed)
Patient ambulatory to triage with steady gait, without difficulty or distress noted; pt reports since 8pm HR felt irregular; denies any pain, denies any accomp symptoms; st prev episode of same recently and dx with afib; eval by Dr Lady Gary but no meds rx for such at this time, just observing for further episodes

## 2021-05-07 NOTE — ED Provider Notes (Signed)
[facility] Provider Note    Event Date/Time   First MD Initiated Contact with Patient 05/07/21 2334     (approximate)   History   Tachycardia   HPI  Melissa Barry is a 76 y.o. female with history of paroxysmal tachycardia, hyperlipidemia not on medications who now comes in with tachycardia.  Patient reports that at around 730 she had eaten some beans and had some sugar flavoring with little caffeine in it and then drink a hot chocolate and started having a lot of gas and then after having the gas she started feeling palpitations and feeling like her heart was racing.  She tried to lay down but it was not getting better.  She is not sure if she still has the palpitations now.  She denies any chest pain, shortness of breath, unilateral leg swelling, recent long travel, recent surgery.  She denies any falls, hitting her head.  Denies any abdominal pain.  She does states that she feels very anxious about her heart.  On review of records from October 2022 patient was seen by cardiology by Dr. Lady Gary and was noted had a Holter monitor showing nonsustained SVT but no episodes of A. fib.  However she did have to go to any ER previously for A. fib with RVR.  Appears that patient not on any medications for it and the plan was to limit caffeine and other stimulants.  She had normal echo in 2019      Physical Exam   Triage Vital Signs: ED Triage Vitals  Enc Vitals Group     BP 05/07/21 2333 (!) 173/96     Pulse Rate 05/07/21 2333 (!) 120     Resp 05/07/21 2333 18     Temp 05/07/21 2333 98.2 F (36.8 C)     Temp Source 05/07/21 2333 Oral     SpO2 05/07/21 2333 97 %     Weight 05/07/21 2319 195 lb (88.5 kg)     Height 05/07/21 2319 5\' 6"  (1.676 m)     Head Circumference --      Peak Flow --      Pain Score 05/07/21 2319 0     Pain Loc --      Pain Edu? --      Excl. in GC? --     Most recent vital signs: Vitals:   05/07/21 2333  BP: (!) 173/96  Pulse: (!)  120  Resp: 18  Temp: 98.2 F (36.8 C)  SpO2: 97%     General: Awake, no distress.  Appears slightly anxious CV:  Good peripheral perfusion.  regular rhythm Resp:  Normal effort.  Clear lung Abd:  No distention.  Soft and nontender Other:  No unilateral leg swelling   ED Results / Procedures / Treatments   Labs (all labs ordered are listed, but only abnormal results are displayed) Labs Reviewed  BASIC METABOLIC PANEL - Abnormal; Notable for the following components:      Result Value   CO2 21 (*)    Glucose, Bld 133 (*)    All other components within normal limits  RESP PANEL BY RT-PCR (FLU A&B, COVID) ARPGX2  CBC  MAGNESIUM  TSH  T4, FREE  TROPONIN I (HIGH SENSITIVITY)  TROPONIN I (HIGH SENSITIVITY)     EKG  My interpretation of EKG: Atrial fibrillation rate of 123 without any ST elevation or T wave inversions, incomplete right bundle branch block   RADIOLOGY I have reviewed the  xray personally and agree with radiology read, negative chest x-ray   PROCEDURES:  Critical Care performed: No  .1-3 Lead EKG Interpretation Performed by: Vanessa Blue Eye, MD Authorized by: Vanessa Garrison, MD     Interpretation: normal     ECG rate:  80s   ECG rate assessment: normal     Rhythm: sinus rhythm     Ectopy: none     Conduction: normal     MEDICATIONS ORDERED IN ED: Medications - No data to display   IMPRESSION / MDM / [place] / ED COURSE  I reviewed the triage vital signs and the nursing notes.  Patient with history of SVT and hyperlipidemia who comes in with palpitations Differential diagnosis includes, but is not limited to, A. fib, ACS, Electra abnormalities, thyroid dysfunction, pneumonia.  COVID, flu test are negative.  Her troponin is negative and patient denied any chest pain.  Her labs are reassuring without significant electrolyte abnormalities on her CMP and her CBC shows no evidence of anemia or evidence of infection.  Patient was  immediately hooked up to the monitor and patient was noted to be back into normal sinus after having an EKG confirming atrial fibrillation with RVR.  She converted without any medications.  The patient is on the cardiac monitor to evaluate for evidence of arrhythmia and/or significant heart rate changes.  I considered admission for patient given she came in with A. fib with RVR but given patient has a reassuring blood work and her EKG is now back into normal sinus patient is safe for discharge home.  Discussed with patient that typically people get started on metoprolol to help prevent patient's going back into the A. fib and that sometimes it can be worse where her blood pressures can get low when she might need cardioversion or other interventions.  However patient states that she does not want to start any medications.  She understands the risk of not starting the metoprolol but will follow-up with Dr. Ubaldo Glassing outpatient.  We also calculated her risk for stroke with a CHA2DS2-VASc score where she scores a 2.  She does not want to start any blood thinner.  I recommended at least an 81 mg aspirin but again she stated that she did not want to start any medications.  We discussed the risk for stroke but again she will follow-up with cardiology.  Patient has been without recurrent symptoms for over 2 hours.  She feels comfortable with going home and will return if she develops return of symptoms or any other concerns.  We did gust avoiding caffeine given this could have been what triggered this episode today  Patient is denying any chest pain feels at her baseline self and feels comfortable with discharge  I discussed the provisional nature of ED diagnosis, the treatment so far, the ongoing plan of care, follow up appointments and return precautions with the patient and any family or support people present. They expressed understanding and agreed with the plan, discharged home.       FINAL CLINICAL  IMPRESSION(S) / ED DIAGNOSES   Final diagnoses:  Atrial fibrillation with rapid ventricular response ([place])     Rx / DC Orders   ED Discharge Orders     None        Note:  This document was prepared using Dragon voice recognition software and may include unintentional dictation errors.   Vanessa Gotebo, MD 05/08/21 [phone]

## 2021-05-08 LAB — CBC
HCT: 43.5 % (ref 36.0–46.0)
Hemoglobin: 14.6 g/dL (ref 12.0–15.0)
MCH: 30.7 pg (ref 26.0–34.0)
MCHC: 33.6 g/dL (ref 30.0–36.0)
MCV: 91.4 fL (ref 80.0–100.0)
Platelets: 218 10*3/uL (ref 150–400)
RBC: 4.76 MIL/uL (ref 3.87–5.11)
RDW: 12.6 % (ref 11.5–15.5)
WBC: 6.5 10*3/uL (ref 4.0–10.5)
nRBC: 0 % (ref 0.0–0.2)

## 2021-05-08 LAB — TROPONIN I (HIGH SENSITIVITY): Troponin I (High Sensitivity): <2 ng/L (ref <18)

## 2021-05-08 LAB — TSH: TSH: 2.615 u[IU]/mL (ref 0.350–4.500)

## 2021-05-08 LAB — BASIC METABOLIC PANEL
Anion gap: 8 (ref 5–15)
BUN: 23 mg/dL (ref 8–23)
CO2: 21 mmol/L — ABNORMAL LOW (ref 22–32)
Calcium: 9 mg/dL (ref 8.9–10.3)
Chloride: 110 mmol/L (ref 98–111)
Creatinine, Ser: 0.93 mg/dL (ref 0.44–1.00)
GFR, Estimated: >60 mL/min (ref >60)
Glucose, Bld: 133 mg/dL — ABNORMAL HIGH (ref 70–99)
Potassium: 3.6 mmol/L (ref 3.5–5.1)
Sodium: 139 mmol/L (ref 135–145)

## 2021-05-08 LAB — RESP PANEL BY RT-PCR (FLU A&B, COVID) ARPGX2
Influenza A by PCR: NEGATIVE
Influenza B by PCR: NEGATIVE
SARS Coronavirus 2 by RT PCR: NEGATIVE

## 2021-05-08 LAB — MAGNESIUM: Magnesium: 2.2 mg/dL (ref 1.7–2.4)

## 2021-05-08 LAB — T4, FREE: Free T4: 1 ng/dL (ref 0.61–1.12)

## 2021-05-08 NOTE — Discharge Instructions (Addendum)
We discussed starting a baby aspirin 81 mg and metoprolol given you went into atrial fibrillation.  You have opted to want to hold off until you get follow-up with cardiology.  Please call them to see if you can get an earlier appointment.  If you have return of symptoms and they are not going away or he develops the return of symptoms with feeling more symptomatic like lightheaded you should return to the ER immediately or if you have any other concerns  In the meantime avoid alcohol, caffeine

## 2021-05-15 DIAGNOSIS — R0602 Shortness of breath: Secondary | ICD-10-CM | POA: Diagnosis not present

## 2021-05-15 DIAGNOSIS — I480 Paroxysmal atrial fibrillation: Secondary | ICD-10-CM | POA: Diagnosis not present

## 2021-05-15 DIAGNOSIS — I10 Essential (primary) hypertension: Secondary | ICD-10-CM | POA: Diagnosis not present

## 2021-05-15 DIAGNOSIS — E7849 Other hyperlipidemia: Secondary | ICD-10-CM | POA: Diagnosis not present

## 2021-05-15 DIAGNOSIS — I479 Paroxysmal tachycardia, unspecified: Secondary | ICD-10-CM | POA: Diagnosis not present

## 2021-05-19 NOTE — Progress Notes (Signed)
Annual Wellness Visit     Patient: Melissa Barry, Female    DOB: 30-Jul-1945, 76 y.o.   MRN: [id number] Visit Date: 05/20/2021  Today's Provider: Lavon Paganini, MD   Chief Complaint  Patient presents with   Annual Exam   Subjective    Melissa Barry is a 76 y.o. female who presents today for her Annual Wellness Visit. She reports consuming a general diet. The patient does not participate in regular exercise at present. She generally feels fairly well. She reports sleeping well. She does have additional problems to discuss today, patient would like to review over records from hospital visits on 01/14/21, 1/18/2. Patient states that she was seen in hospital and diagnosed with A-Fib, patient has since follow up with cardiologist Dr. Ubaldo Glassing and has been started on Metoprolol and states that she currently has a Zio monitor.  HPI  Had cataract surgery in June 2022  Medications: Outpatient Medications Prior to Visit  Medication Sig   metoprolol succinate (TOPROL-XL) 25 MG 24 hr tablet Take 1 tablet by mouth daily.   Multiple Vitamins-Minerals (PRESERVISION AREDS PO) Take by mouth.   [DISCONTINUED] ibuprofen (ADVIL) 200 MG tablet Take 200 mg by mouth every 6 (six) hours as needed. (Patient not taking: Reported on 05/20/2021)   No facility-administered medications prior to visit.    Allergies  Allergen Reactions   Doxycycline Other (See Comments)    Made her feel sick the whole time she was taking it.    Penicillins Rash   Sulfa Antibiotics Swelling and Rash    Patient states throat swelling     Patient Care Team: Virginia Crews, MD as PCP - General (Family Medicine) Thornton Park, MD as Referring Physician (Orthopedic Surgery) Birder Robson, MD as Referring Physician (Ophthalmology) Oneta Rack, MD (Dermatology)  Review of Systems  Constitutional: Negative.   Respiratory: Negative.    Cardiovascular: Negative.   Neurological: Negative.    Psychiatric/Behavioral: Negative.         Objective    Vitals: BP 135/72 (BP Location: Left Arm, Patient Position: Sitting, Cuff Size: Normal)    Pulse 61    Temp 98.1 F (36.7 C) (Oral)    Resp 16    Ht 5\' 5"  (1.651 m)    Wt 191 lb 11.2 oz (87 kg)    SpO2 98%    BMI 31.90 kg/m    Physical Exam Vitals reviewed.  Constitutional:      General: She is not in acute distress.    Appearance: Normal appearance. She is well-developed. She is not diaphoretic.  HENT:     Head: Normocephalic and atraumatic.     Right Ear: Tympanic membrane, ear canal and external ear normal.     Left Ear: Tympanic membrane, ear canal and external ear normal.     Nose: Nose normal.     Mouth/Throat:     Mouth: Mucous membranes are moist.     Pharynx: Oropharynx is clear. No oropharyngeal exudate.  Eyes:     General: No scleral icterus.    Conjunctiva/sclera: Conjunctivae normal.     Pupils: Pupils are equal, round, and reactive to light.  Neck:     Thyroid: No thyromegaly.  Cardiovascular:     Rate and Rhythm: Normal rate and regular rhythm.     Pulses: Normal pulses.     Heart sounds: Normal heart sounds. No murmur heard. Pulmonary:     Effort: Pulmonary effort is normal. No respiratory distress.  Breath sounds: Normal breath sounds. No wheezing or rales.  Abdominal:     General: There is no distension.     Palpations: Abdomen is soft.     Tenderness: There is no abdominal tenderness.  Musculoskeletal:        General: No deformity.     Cervical back: Neck supple.     Right lower leg: No edema.     Left lower leg: No edema.  Lymphadenopathy:     Cervical: No cervical adenopathy.  Skin:    General: Skin is warm and dry.     Findings: No rash.  Neurological:     Mental Status: She is alert and oriented to person, place, and time. Mental status is at baseline.     Sensory: No sensory deficit.     Motor: No weakness.     Gait: Gait normal.  Psychiatric:        Mood and Affect: Mood  normal.        Behavior: Behavior normal.        Thought Content: Thought content normal.    Most recent functional status assessment: In your present state of health, do you have any difficulty performing the following activities: 11/12/2020  Hearing? N  Vision? N  Comment -  Difficulty concentrating or making decisions? N  Walking or climbing stairs? N  Dressing or bathing? N  Doing errands, shopping? -  Some recent data might be hidden   Most recent fall risk assessment: Fall Risk  10/31/2020  Falls in the past year? 0  Number falls in past yr: 0  Injury with Fall? 0  Risk for fall due to : No Fall Risks  Follow up -    Most recent depression screenings: PHQ 2/9 Scores 05/20/2021 10/31/2020  PHQ - 2 Score 0 0  PHQ- 9 Score 3 0   Most recent cognitive screening: 6CIT Screen 05/02/2020  What Year? 0 points  What month? 0 points  What time? 0 points  Count back from 20 0 points  Months in reverse 0 points  Repeat phrase 0 points  Total Score 0   Most recent Audit-C alcohol use screening Alcohol Use Disorder Test (AUDIT) 10/31/2020  1. How often do you have a drink containing alcohol? 3  2. How many drinks containing alcohol do you have on a typical day when you are drinking? 0  3. How often do you have six or more drinks on one occasion? 0  AUDIT-C Score 3  Alcohol Brief Interventions/Follow-up -   A score of 3 or more in women, and 4 or more in men indicates increased risk for alcohol abuse, EXCEPT if all of the points are from question 1   No results found for any visits on 05/20/21.  Assessment & Plan     Annual wellness visit done today including the all of the following: Reviewed patient's Family Medical History Reviewed and updated list of patient's medical providers Assessment of cognitive impairment was done Assessed patient's functional ability Established a written schedule for health screening [place] Completed and  Reviewed  Exercise Activities and Dietary recommendations  Goals      Exercise 3x per week (30 min per time)     Recommend doing some form of exercise for 3 days a week for at least 30 minutes.   02/24/18, Continue trying to walk or exercise for 3 days a week for at least 30 mins a day.      LIFESTYLE -  DECREASE FALLS RISK     Recommend to remove any items from the home that may cause slips or trips.        Immunization History  Administered Date(s) Administered   PFIZER(Purple Top)SARS-COV-2 Vaccination 06/03/2019, 06/24/2019, 01/25/2020   Pneumococcal Conjugate-13 09/24/2015   Pneumococcal Polysaccharide-23 02/03/2013   Td 06/24/2010   Tdap 06/24/2010    Health Maintenance  Topic Date Due   Zoster Vaccines- Shingrix (1 of 2) Never done   COVID-19 Vaccine (4 - Booster for Pfizer series) 03/21/2020   TETANUS/TDAP  06/23/2020   INFLUENZA VACCINE  07/18/2021 (Originally 11/18/2020)   MAMMOGRAM  05/24/2022   COLONOSCOPY (Pts 45-43yrs Insurance coverage will need to be confirmed)  12/16/2025   Pneumonia Vaccine 45+ Years old  Completed   DEXA SCAN  Completed   Hepatitis C Screening  Completed   HPV VACCINES  Aged Out     Discussed health benefits of physical activity, and encouraged her to engage in regular exercise appropriate for her age and condition.    Problem List Items Addressed This Visit       Cardiovascular and Mediastinum   Paroxysmal atrial fibrillation (HCC)    F/b cardiology Continue metoprolol Was told she didn't need a blood thinner      Relevant Medications   metoprolol succinate (TOPROL-XL) 25 MG 24 hr tablet   Primary hypertension    Well controlled on manual recheck Continue current medications Reviewed recent metabolic panel      Relevant Medications   metoprolol succinate (TOPROL-XL) 25 MG 24 hr tablet     Other   Hypercholesterolemia    Reviewed last lipid panel Not currently on a statin Recheck FLP and CMP Discussed diet and  exercise       Relevant Medications   metoprolol succinate (TOPROL-XL) 25 MG 24 hr tablet   Other Relevant Orders   Hepatic function panel   Lipid panel   Obesity    Discussed importance of healthy weight management Discussed diet and exercise       Other Visit Diagnoses     Encounter for annual wellness visit (AWV) in Medicare patient    -  Primary   Encounter for annual physical exam       Relevant Orders   Hepatic function panel   Lipid panel   Screening mammogram for breast cancer       Relevant Orders   MM 3D SCREEN BREAST BILATERAL        Return in about 6 months (around 11/17/2021) for chronic disease f/u.      Patient seen and examined by Dr. Lavon Paganini note scribed by Jennings Books, NCMA    I, Lavon Paganini, MD, have reviewed all documentation for this visit. The documentation on 05/20/21 for the exam, diagnosis, procedures, and orders are all accurate and complete.   Mariesa Grieder, Dionne Bucy, MD, MPH [facility]

## 2021-05-20 ENCOUNTER — Encounter: Payer: Self-pay | Admitting: Family Medicine

## 2021-05-20 ENCOUNTER — Other Ambulatory Visit: Payer: Self-pay

## 2021-05-20 ENCOUNTER — Ambulatory Visit (INDEPENDENT_AMBULATORY_CARE_PROVIDER_SITE_OTHER): Payer: Medicare PPO | Admitting: Family Medicine

## 2021-05-20 VITALS — BP 135/72 | HR 61 | Temp 98.1°F | Resp 16 | Ht 65.0 in | Wt 191.7 lb

## 2021-05-20 DIAGNOSIS — I480 Paroxysmal atrial fibrillation: Secondary | ICD-10-CM | POA: Diagnosis not present

## 2021-05-20 DIAGNOSIS — Z0000 Encounter for general adult medical examination without abnormal findings: Secondary | ICD-10-CM

## 2021-05-20 DIAGNOSIS — Z6831 Body mass index (BMI) 31.0-31.9, adult: Secondary | ICD-10-CM

## 2021-05-20 DIAGNOSIS — Z1231 Encounter for screening mammogram for malignant neoplasm of breast: Secondary | ICD-10-CM

## 2021-05-20 DIAGNOSIS — E669 Obesity, unspecified: Secondary | ICD-10-CM

## 2021-05-20 DIAGNOSIS — I10 Essential (primary) hypertension: Secondary | ICD-10-CM

## 2021-05-20 DIAGNOSIS — E7800 Pure hypercholesterolemia, unspecified: Secondary | ICD-10-CM | POA: Diagnosis not present

## 2021-05-20 NOTE — Assessment & Plan Note (Signed)
Reviewed last lipid panel Not currently on a statin Recheck FLP and CMP Discussed diet and exercise  

## 2021-05-20 NOTE — Patient Instructions (Signed)
The CDC recommends two doses of Shingrix (the shingles vaccine) separated by 2 to 6 months for adults age 74 years and older. I recommend checking with your insurance plan regarding coverage for this vaccine.    Check with your pharmacy about the TDAP (tetanus vaccine)

## 2021-05-20 NOTE — Assessment & Plan Note (Signed)
Discussed importance of healthy weight management Discussed diet and exercise  

## 2021-05-20 NOTE — Assessment & Plan Note (Addendum)
F/b cardiology Continue metoprolol Was told she didn't need a blood thinner

## 2021-05-20 NOTE — Assessment & Plan Note (Signed)
Well controlled on manual recheck Continue current medications Reviewed recent metabolic panel 

## 2021-05-21 LAB — HEPATIC FUNCTION PANEL
ALT: 15 IU/L (ref 0–32)
AST: 20 IU/L (ref 0–40)
Albumin: 4.6 g/dL (ref 3.7–4.7)
Alkaline Phosphatase: 78 IU/L (ref 44–121)
Bilirubin Total: 0.6 mg/dL (ref 0.0–1.2)
Bilirubin, Direct: 0.19 mg/dL (ref 0.00–0.40)
Total Protein: 6.6 g/dL (ref 6.0–8.5)

## 2021-05-21 LAB — LIPID PANEL
Chol/HDL Ratio: 3.8 ratio (ref 0.0–4.4)
Cholesterol, Total: 273 mg/dL — ABNORMAL HIGH (ref 100–199)
HDL: 72 mg/dL (ref >39)
LDL Chol Calc (NIH): 181 mg/dL — ABNORMAL HIGH (ref 0–99)
Triglycerides: 115 mg/dL (ref 0–149)
VLDL Cholesterol Cal: 20 mg/dL (ref 5–40)

## 2021-05-29 DIAGNOSIS — I480 Paroxysmal atrial fibrillation: Secondary | ICD-10-CM | POA: Diagnosis not present

## 2021-06-16 DIAGNOSIS — H43813 Vitreous degeneration, bilateral: Secondary | ICD-10-CM | POA: Diagnosis not present

## 2021-06-26 DIAGNOSIS — E669 Obesity, unspecified: Secondary | ICD-10-CM | POA: Diagnosis not present

## 2021-06-26 DIAGNOSIS — R0602 Shortness of breath: Secondary | ICD-10-CM | POA: Diagnosis not present

## 2021-06-26 DIAGNOSIS — Z6833 Body mass index (BMI) 33.0-33.9, adult: Secondary | ICD-10-CM | POA: Diagnosis not present

## 2021-06-26 DIAGNOSIS — I480 Paroxysmal atrial fibrillation: Secondary | ICD-10-CM | POA: Diagnosis not present

## 2021-06-26 DIAGNOSIS — E7849 Other hyperlipidemia: Secondary | ICD-10-CM | POA: Diagnosis not present

## 2021-06-26 DIAGNOSIS — I10 Essential (primary) hypertension: Secondary | ICD-10-CM | POA: Diagnosis not present

## 2021-06-26 DIAGNOSIS — I479 Paroxysmal tachycardia, unspecified: Secondary | ICD-10-CM | POA: Diagnosis not present

## 2021-09-01 NOTE — Progress Notes (Signed)
?  ? ?I,Sulibeya S Dimas,acting as a scribe for Lavon Paganini, MD.,have documented all relevant documentation on the behalf of Lavon Paganini, MD,as directed by  Lavon Paganini, MD while in the presence of Lavon Paganini, MD. ? ? ?Established patient visit ? ? ?Patient: Melissa Barry   DOB: [birth year]   76 y.o. Female  MRN: [id number] ?Visit Date: 09/02/2021 ? ?Today's healthcare provider: Lavon Paganini, MD  ? ?Chief Complaint  ?Patient presents with  ? URI  ? ?Subjective  ?  ?HPI  ?Upper respiratory symptoms ?She complains of nasal congestion, no  fever, and non productive cough.with no fever, chills, night sweats or weight loss. Onset of symptoms was a few days ago and rapidly improving.She is drinking plenty of fluids.  Past history is significant for no history of pneumonia or bronchitis. Patient is non-smoker ? ?Start of symptoms 5/11.  Had sat in ER waiting room for 6 hours prior to that.  Tried allergy meds, Delsym, Alka Seltzer. ? ?Doesn't think she has had COVID or other URI in the last 3 years. ? ?--------------------------------------------------------------------------------------------------- ? ? ?Medications: ?Outpatient Medications Prior to Visit  ?Medication Sig  ? metoprolol succinate (TOPROL-XL) 25 MG 24 hr tablet Take 1 tablet by mouth daily.  ? Multiple Vitamins-Minerals (PRESERVISION AREDS PO) Take by mouth.  ? ?No facility-administered medications prior to visit.  ? ? ?Review of Systems  ?Constitutional:  Negative for appetite change, chills and fatigue.  ?HENT:  Positive for congestion.   ?Respiratory:  Positive for cough.   ? ? ?  Objective  ?  ?BP 128/84 (BP Location: Left Arm, Patient Position: Sitting, Cuff Size: Large)   Pulse 69   Temp 98.2 ?F (36.8 ?C) (Oral)   Resp 20   Wt 197 lb (89.4 kg)   SpO2 98%   BMI 32.78 kg/m?  ?BP Readings from Last 3 Encounters:  ?09/02/21 128/84  ?05/20/21 135/72  ?05/08/21 138/62  ? ?Wt Readings from Last 3 Encounters:  ?09/02/21 197  lb (89.4 kg)  ?05/20/21 191 lb 11.2 oz (87 kg)  ?05/07/21 195 lb (88.5 kg)  ? ?  ?Physical Exam ?Vitals reviewed.  ?Constitutional:   ?   General: She is not in acute distress. ?   Appearance: Normal appearance. She is well-developed. She is not diaphoretic.  ?HENT:  ?   Head: Normocephalic and atraumatic.  ?Eyes:  ?   General: No scleral icterus. ?   Conjunctiva/sclera: Conjunctivae normal.  ?Neck:  ?   Thyroid: No thyromegaly.  ?Cardiovascular:  ?   Rate and Rhythm: Normal rate and regular rhythm.  ?   Pulses: Normal pulses.  ?   Heart sounds: Normal heart sounds. No murmur heard. ?Pulmonary:  ?   Effort: Pulmonary effort is normal. No respiratory distress.  ?   Breath sounds: Normal breath sounds. No wheezing, rhonchi or rales.  ?Musculoskeletal:  ?   Cervical back: Neck supple.  ?   Right lower leg: No edema.  ?   Left lower leg: No edema.  ?Lymphadenopathy:  ?   Cervical: No cervical adenopathy.  ?Skin: ?   General: Skin is warm and dry.  ?   Findings: No rash.  ?Neurological:  ?   Mental Status: She is alert and oriented to person, place, and time. Mental status is at baseline.  ?Psychiatric:     ?   Mood and Affect: Mood normal.     ?   Behavior: Behavior normal.  ?  ? ? ?No results found  for any visits on 09/02/21. ? Assessment & Plan  ?  ? ?1. Viral URI with cough ?- symptoms and exam c/w viral URI - no evidence of strep pharyngitis, CAP, AOM, bacterial sinusitis, or other bacterial infection ?- will test for COVID but advised that she is outside of the treatment window ?- discussed symptomatic management, natural course, and return precautions  ?- Novel Coronavirus, NAA ([facility]) ? ? ?Return if symptoms worsen or fail to improve.  ?   ? ?I, Lavon Paganini, MD, have reviewed all documentation for this visit. The documentation on 09/02/21 for the exam, diagnosis, procedures, and orders are all accurate and complete. ? ? ?Virginia Crews, MD, MPH ?[place] ?[facility]    ?

## 2021-09-02 ENCOUNTER — Encounter: Payer: Self-pay | Admitting: Family Medicine

## 2021-09-02 ENCOUNTER — Ambulatory Visit
Admission: RE | Admit: 2021-09-02 | Discharge: 2021-09-02 | Disposition: A | Discharge status: Home / Self Care | Payer: Medicare PPO | Source: Ambulatory Visit | Attending: Family Medicine | Admitting: Family Medicine

## 2021-09-02 ENCOUNTER — Ambulatory Visit: Payer: Medicare PPO | Admitting: Family Medicine

## 2021-09-02 VITALS — BP 128/84 | HR 69 | Temp 98.2°F | Resp 20 | Wt 197.0 lb

## 2021-09-02 DIAGNOSIS — Z1231 Encounter for screening mammogram for malignant neoplasm of breast: Secondary | ICD-10-CM

## 2021-09-02 DIAGNOSIS — J069 Acute upper respiratory infection, unspecified: Secondary | ICD-10-CM

## 2021-09-03 LAB — NOVEL CORONAVIRUS, NAA: SARS-CoV-2, NAA: NOT DETECTED

## 2022-01-01 DIAGNOSIS — I479 Paroxysmal tachycardia, unspecified: Secondary | ICD-10-CM | POA: Diagnosis not present

## 2022-01-01 DIAGNOSIS — I10 Essential (primary) hypertension: Secondary | ICD-10-CM | POA: Diagnosis not present

## 2022-01-01 DIAGNOSIS — R0602 Shortness of breath: Secondary | ICD-10-CM | POA: Diagnosis not present

## 2022-01-01 DIAGNOSIS — I480 Paroxysmal atrial fibrillation: Secondary | ICD-10-CM | POA: Diagnosis not present

## 2022-01-01 DIAGNOSIS — E7849 Other hyperlipidemia: Secondary | ICD-10-CM | POA: Diagnosis not present

## 2022-02-27 DIAGNOSIS — L720 Epidermal cyst: Secondary | ICD-10-CM | POA: Diagnosis not present

## 2022-02-27 DIAGNOSIS — B078 Other viral warts: Secondary | ICD-10-CM | POA: Diagnosis not present

## 2022-02-27 DIAGNOSIS — L728 Other follicular cysts of the skin and subcutaneous tissue: Secondary | ICD-10-CM | POA: Diagnosis not present

## 2022-02-27 DIAGNOSIS — L821 Other seborrheic keratosis: Secondary | ICD-10-CM | POA: Diagnosis not present

## 2022-02-27 DIAGNOSIS — D2272 Melanocytic nevi of left lower limb, including hip: Secondary | ICD-10-CM | POA: Diagnosis not present

## 2022-02-27 DIAGNOSIS — D2261 Melanocytic nevi of right upper limb, including shoulder: Secondary | ICD-10-CM | POA: Diagnosis not present

## 2022-02-27 DIAGNOSIS — D2262 Melanocytic nevi of left upper limb, including shoulder: Secondary | ICD-10-CM | POA: Diagnosis not present

## 2022-02-27 DIAGNOSIS — D225 Melanocytic nevi of trunk: Secondary | ICD-10-CM | POA: Diagnosis not present

## 2022-02-27 DIAGNOSIS — D2271 Melanocytic nevi of right lower limb, including hip: Secondary | ICD-10-CM | POA: Diagnosis not present

## 2022-04-05 DIAGNOSIS — Z03818 Encounter for observation for suspected exposure to other biological agents ruled out: Secondary | ICD-10-CM | POA: Diagnosis not present

## 2022-04-05 DIAGNOSIS — U071 COVID-19: Secondary | ICD-10-CM | POA: Diagnosis not present

## 2022-05-20 NOTE — Progress Notes (Unsigned)
I,Iman Orourke S Cherylene Ferrufino,acting as a Neurosurgeon for Shirlee Latch, MD.,have documented all relevant documentation on the behalf of Shirlee Latch, MD,as directed by  Shirlee Latch, MD while in the presence of Shirlee Latch, MD.    Annual Wellness Visit     Patient: Melissa Barry, Female    DOB: Aug 19, 1945, 77 y.o.   MRN: [id number] Visit Date: 05/21/2022  Today's Provider: Shirlee Latch, MD   Chief Complaint  Patient presents with   Medicare Wellness   Subjective    Melissa Barry is a 77 y.o. female who presents today for her Annual Wellness Visit. She reports consuming a general diet. The patient does not participate in regular exercise at present. She generally feels well. She reports sleeping fairly well. She does not have additional problems to discuss today.   HPI Flu vaccine-refused 6 CIT-refused  Medications: Outpatient Medications Prior to Visit  Medication Sig   metoprolol succinate (TOPROL-XL) 25 MG 24 hr tablet Take 1 tablet by mouth daily.   Multiple Vitamins-Minerals (PRESERVISION AREDS PO) Take by mouth.   No facility-administered medications prior to visit.    Allergies  Allergen Reactions   Doxycycline Other (See Comments)    Made her feel sick the whole time she was taking it.    Penicillins Rash   Sulfa Antibiotics Swelling and Rash    Patient states throat swelling     Patient Care Team: Erasmo Downer, MD as PCP - General (Family Medicine) Juanell Fairly, MD as Referring Physician (Orthopedic Surgery) Galen Manila, MD as Referring Physician (Ophthalmology) Debbrah Alar, MD (Dermatology)  Review of Systems  All other systems reviewed and are negative.   Last CBC Lab Results  Component Value Date   WBC 6.5 05/08/2021   HGB 14.6 05/08/2021   HCT 43.5 05/08/2021   MCV 91.4 05/08/2021   MCH 30.7 05/08/2021   RDW 12.6 05/08/2021   PLT 218 05/08/2021   Last metabolic panel Lab Results  Component Value Date    GLUCOSE 133 (H) 05/08/2021   NA 139 05/08/2021   K 3.6 05/08/2021   CL 110 05/08/2021   CO2 21 (L) 05/08/2021   BUN 23 05/08/2021   CREATININE 0.93 05/08/2021   GFRNONAA >60 05/08/2021   CALCIUM 9.0 05/08/2021   PROT 6.6 05/20/2021   ALBUMIN 4.6 05/20/2021   LABGLOB 2.1 11/01/2020   AGRATIO 2.2 11/01/2020   BILITOT 0.6 05/20/2021   ALKPHOS 78 05/20/2021   AST 20 05/20/2021   ALT 15 05/20/2021   ANIONGAP 8 05/08/2021   Last lipids Lab Results  Component Value Date   CHOL 273 (H) 05/20/2021   HDL 72 05/20/2021   LDLCALC 181 (H) 05/20/2021   TRIG 115 05/20/2021   CHOLHDL 3.8 05/20/2021   Last hemoglobin A1c Lab Results  Component Value Date   HGBA1C 5.1 09/25/2015   Last thyroid functions Lab Results  Component Value Date   TSH 2.615 05/08/2021   Last vitamin B12 and Folate Lab Results  Component Value Date   VITAMINB12 635 02/24/2018        Objective    Vitals: BP 138/74 (BP Location: Left Arm, Patient Position: Sitting, Cuff Size: Large)   Pulse 62   Temp 98.6 F (37 C) (Temporal)   Resp 16   Ht 5' 3.5" (1.613 m)   Wt 194 lb 11.2 oz (88.3 kg)   BMI 33.95 kg/m  BP Readings from Last 3 Encounters:  05/21/22 138/74  09/02/21 128/84  05/20/21 135/72  Wt Readings from Last 3 Encounters:  05/21/22 194 lb 11.2 oz (88.3 kg)  09/02/21 197 lb (89.4 kg)  05/20/21 191 lb 11.2 oz (87 kg)      Physical Exam Vitals reviewed.  Constitutional:      General: She is not in acute distress.    Appearance: Normal appearance. She is well-developed. She is not diaphoretic.  HENT:     Head: Normocephalic and atraumatic.     Right Ear: External ear normal.     Left Ear: External ear normal.     Nose: Nose normal.     Mouth/Throat:     Mouth: Mucous membranes are moist.     Pharynx: Oropharynx is clear. No oropharyngeal exudate.  Eyes:     General: No scleral icterus.    Conjunctiva/sclera: Conjunctivae normal.     Pupils: Pupils are equal, round, and  reactive to light.  Neck:     Thyroid: No thyromegaly.  Cardiovascular:     Rate and Rhythm: Normal rate and regular rhythm.     Heart sounds: Normal heart sounds. No murmur heard. Pulmonary:     Effort: Pulmonary effort is normal. No respiratory distress.     Breath sounds: Normal breath sounds. No wheezing or rales.  Abdominal:     General: There is no distension.     Palpations: Abdomen is soft.     Tenderness: There is no abdominal tenderness.  Musculoskeletal:     Cervical back: Neck supple.     Right lower leg: No edema.     Left lower leg: No edema.  Lymphadenopathy:     Cervical: No cervical adenopathy.  Skin:    General: Skin is warm and dry.     Findings: No rash.  Neurological:     Mental Status: She is alert and oriented to person, place, and time. Mental status is at baseline.     Gait: Gait normal.  Psychiatric:        Mood and Affect: Mood normal.        Behavior: Behavior normal.        Thought Content: Thought content normal.     Most recent functional status assessment:    05/21/2022    9:06 AM  In your present state of health, do you have any difficulty performing the following activities:  Hearing? 0  Vision? 0  Difficulty concentrating or making decisions? 0  Walking or climbing stairs? 0  Dressing or bathing? 0  Doing errands, shopping? 0   Most recent fall risk assessment:    05/21/2022    9:06 AM  Fall Risk   Falls in the past year? 0  Number falls in past yr: 0  Injury with Fall? 0  Risk for fall due to : No Fall Risks  Follow up Falls evaluation completed    Most recent depression screenings:    05/21/2022    9:06 AM 09/02/2021    9:14 AM  PHQ 2/9 Scores  PHQ - 2 Score 0 0  PHQ- 9 Score 0 0   Most recent cognitive screening:    05/02/2020    1:53 PM  6CIT Screen  What Year? 0 points  What month? 0 points  What time? 0 points  Count back from 20 0 points  Months in reverse 0 points  Repeat phrase 0 points  Total Score 0  points   Most recent Audit-C alcohol use screening    05/21/2022    9:07 AM  Alcohol Use  Disorder Test (AUDIT)  1. How often do you have a drink containing alcohol? 2  2. How many drinks containing alcohol do you have on a typical day when you are drinking? 0  3. How often do you have six or more drinks on one occasion? 0  AUDIT-C Score 2   A score of 3 or more in women, and 4 or more in men indicates increased risk for alcohol abuse, EXCEPT if all of the points are from question 1   No results found for any visits on 05/21/22.  Assessment & Plan     Annual wellness visit done today including the all of the following: Reviewed patient's Family Medical History Reviewed and updated list of patient's medical providers Assessment of cognitive impairment was done Assessed patient's functional ability Established a written schedule for health screening [place] Completed and Reviewed  Exercise Activities and Dietary recommendations  Goals      Exercise 3x per week (30 min per time)     Recommend doing some form of exercise for 3 days a week for at least 30 minutes.   02/24/18, Continue trying to walk or exercise for 3 days a week for at least 30 mins a day.      LIFESTYLE - DECREASE FALLS RISK     Recommend to remove any items from the home that may cause slips or trips.        Immunization History  Administered Date(s) Administered   Moderna Covid-19 Vaccine Bivalent Booster 48yrs & up 02/18/2021   PFIZER(Purple Top)SARS-COV-2 Vaccination 06/03/2019, 06/24/2019, 01/25/2020   Pneumococcal Conjugate-13 09/24/2015   Pneumococcal Polysaccharide-23 02/03/2013   Td 06/24/2010   Tdap 06/24/2010    Health Maintenance  Topic Date Due   Zoster Vaccines- Shingrix (1 of 2) Never done   DTaP/Tdap/Td (3 - Td or Tdap) 06/23/2020   COVID-19 Vaccine (5 - 2023-24 season) 12/19/2021   INFLUENZA VACCINE  07/19/2022 (Originally 11/18/2021)   Medicare Annual Wellness  ([place])  05/22/2023   MAMMOGRAM  09/03/2023   COLONOSCOPY (Pts 45-36yrs Insurance coverage will need to be confirmed)  12/16/2025   Pneumonia Vaccine 49+ Years old  Completed   DEXA SCAN  Completed   Hepatitis C Screening  Completed   HPV VACCINES  Aged Out     Discussed health benefits of physical activity, and encouraged her to engage in regular exercise appropriate for her age and condition.    Problem List Items Addressed This Visit       Cardiovascular and Mediastinum   Paroxysmal atrial fibrillation [facility])    F/b Cardiology  Continue metoprolol  Was told she didn't need anticoag      Primary hypertension    Well controlled Continue current medications Recheck metabolic panel F/u in 6 months       Relevant Orders   Comprehensive metabolic panel     Other   Hypercholesterolemia    Reviewed last lipid panel Not currently on a statin Recheck FLP and CMP Discussed diet and exercise       Relevant Orders   Lipid Panel With LDL/HDL Ratio   Obesity    Discussed importance of healthy weight management Discussed diet and exercise       Other Visit Diagnoses     Encounter for annual wellness visit (AWV) in Medicare patient    -  Primary   Relevant Orders   Comprehensive metabolic panel   Lipid Panel With LDL/HDL Ratio   Encounter for annual physical  exam       Breast cancer screening by mammogram       Relevant Orders   MM 3D SCREEN BREAST BILATERAL   Hyperglycemia       Relevant Orders   Hemoglobin A1c        Return in about 1 year (around 05/22/2023) for CPE, AWV.     I, Lavon Paganini, MD, have reviewed all documentation for this visit. The documentation on 05/21/22 for the exam, diagnosis, procedures, and orders are all accurate and complete.   Bacigalupo, Dionne Bucy, MD, MPH [facility]

## 2022-05-21 ENCOUNTER — Ambulatory Visit (INDEPENDENT_AMBULATORY_CARE_PROVIDER_SITE_OTHER): Payer: Medicare PPO | Admitting: Family Medicine

## 2022-05-21 ENCOUNTER — Encounter: Payer: Self-pay | Admitting: Family Medicine

## 2022-05-21 VITALS — BP 138/74 | HR 62 | Temp 98.6°F | Resp 16 | Ht 63.5 in | Wt 194.7 lb

## 2022-05-21 DIAGNOSIS — E669 Obesity, unspecified: Secondary | ICD-10-CM | POA: Diagnosis not present

## 2022-05-21 DIAGNOSIS — Z6831 Body mass index (BMI) 31.0-31.9, adult: Secondary | ICD-10-CM | POA: Diagnosis not present

## 2022-05-21 DIAGNOSIS — E7800 Pure hypercholesterolemia, unspecified: Secondary | ICD-10-CM

## 2022-05-21 DIAGNOSIS — R739 Hyperglycemia, unspecified: Secondary | ICD-10-CM

## 2022-05-21 DIAGNOSIS — E66811 Body mass index (BMI) 31.0-31.9, adult: Secondary | ICD-10-CM

## 2022-05-21 DIAGNOSIS — Z1231 Encounter for screening mammogram for malignant neoplasm of breast: Secondary | ICD-10-CM

## 2022-05-21 DIAGNOSIS — I480 Paroxysmal atrial fibrillation: Secondary | ICD-10-CM | POA: Diagnosis not present

## 2022-05-21 DIAGNOSIS — Z0000 Encounter for general adult medical examination without abnormal findings: Secondary | ICD-10-CM

## 2022-05-21 DIAGNOSIS — Z87891 Personal history of nicotine dependence: Secondary | ICD-10-CM | POA: Diagnosis not present

## 2022-05-21 DIAGNOSIS — I10 Essential (primary) hypertension: Secondary | ICD-10-CM | POA: Diagnosis not present

## 2022-05-21 NOTE — Assessment & Plan Note (Signed)
Well controlled Continue current medications Recheck metabolic panel F/u in 6 months  

## 2022-05-21 NOTE — Assessment & Plan Note (Signed)
Reviewed last lipid panel Not currently on a statin Recheck FLP and CMP Discussed diet and exercise  

## 2022-05-21 NOTE — Assessment & Plan Note (Signed)
Discussed importance of healthy weight management Discussed diet and exercise  

## 2022-05-21 NOTE — Assessment & Plan Note (Signed)
F/b Cardiology  Continue metoprolol  Was told she didn't need anticoag

## 2022-05-22 LAB — COMPREHENSIVE METABOLIC PANEL
ALT: 11 IU/L (ref 0–32)
AST: 16 IU/L (ref 0–40)
Albumin/Globulin Ratio: 2.5 — ABNORMAL HIGH (ref 1.2–2.2)
Albumin: 4.7 g/dL (ref 3.8–4.8)
Alkaline Phosphatase: 81 IU/L (ref 44–121)
BUN/Creatinine Ratio: 19 (ref 12–28)
BUN: 16 mg/dL (ref 8–27)
Bilirubin Total: 0.7 mg/dL (ref 0.0–1.2)
CO2: 24 mmol/L (ref 20–29)
Calcium: 9.9 mg/dL (ref 8.7–10.3)
Chloride: 104 mmol/L (ref 96–106)
Creatinine, Ser: 0.84 mg/dL (ref 0.57–1.00)
Globulin, Total: 1.9 g/dL (ref 1.5–4.5)
Glucose: 93 mg/dL (ref 70–99)
Potassium: 4.8 mmol/L (ref 3.5–5.2)
Sodium: 141 mmol/L (ref 134–144)
Total Protein: 6.6 g/dL (ref 6.0–8.5)
eGFR: 73 mL/min/{1.73_m2} (ref >59)

## 2022-05-22 LAB — LIPID PANEL WITH LDL/HDL RATIO
Cholesterol, Total: 305 mg/dL — ABNORMAL HIGH (ref 100–199)
HDL: 66 mg/dL (ref >39)
LDL Chol Calc (NIH): 212 mg/dL — ABNORMAL HIGH (ref 0–99)
LDL/HDL Ratio: 3.2 ratio (ref 0.0–3.2)
Triglycerides: 147 mg/dL (ref 0–149)
VLDL Cholesterol Cal: 27 mg/dL (ref 5–40)

## 2022-05-22 LAB — HEMOGLOBIN A1C
Est. average glucose Bld gHb Est-mCnc: 111 mg/dL
Hgb A1c MFr Bld: 5.5 % (ref 4.8–5.6)

## 2022-06-17 DIAGNOSIS — H43813 Vitreous degeneration, bilateral: Secondary | ICD-10-CM | POA: Diagnosis not present

## 2022-06-17 DIAGNOSIS — Z961 Presence of intraocular lens: Secondary | ICD-10-CM | POA: Diagnosis not present

## 2022-06-17 DIAGNOSIS — H353131 Nonexudative age-related macular degeneration, bilateral, early dry stage: Secondary | ICD-10-CM | POA: Diagnosis not present

## 2022-06-17 DIAGNOSIS — H1013 Acute atopic conjunctivitis, bilateral: Secondary | ICD-10-CM | POA: Diagnosis not present

## 2022-06-18 ENCOUNTER — Ambulatory Visit: Payer: Medicare PPO | Admitting: Family Medicine

## 2022-06-18 ENCOUNTER — Encounter: Payer: Self-pay | Admitting: Family Medicine

## 2022-06-18 VITALS — BP 125/60 | HR 69 | Temp 98.1°F | Resp 12 | Wt 196.4 lb

## 2022-06-18 DIAGNOSIS — J069 Acute upper respiratory infection, unspecified: Secondary | ICD-10-CM

## 2022-06-18 LAB — POC COVID19 BINAXNOW: SARS Coronavirus 2 Ag: NEGATIVE

## 2022-06-18 LAB — POCT INFLUENZA A/B
Influenza A, POC: NEGATIVE
Influenza B, POC: NEGATIVE

## 2022-06-18 MED ORDER — GUAIFENESIN-CODEINE 100-10 MG/5ML PO SOLN
5.0000 mL | Freq: Three times a day (TID) | ORAL | 0 refills | Status: DC | PRN
Start: 2022-06-18 — End: 2022-06-22

## 2022-06-18 NOTE — Progress Notes (Signed)
I,Sulibeya S Dimas,acting as a Education administrator for Lavon Paganini, MD.,have documented all relevant documentation on the behalf of Lavon Paganini, MD,as directed by  Lavon Paganini, MD while in the presence of Lavon Paganini, MD.     Established patient visit   Patient: Melissa Barry   DOB: 1946/03/16   77 y.o. Female  MRN: [id number] Visit Date: 06/18/2022  Today's healthcare provider: Lavon Paganini, MD   Chief Complaint  Patient presents with   URI   Subjective     Upper respiratory symptoms She complains of congestion, cough described as barking and nocturnal, nasal congestion, and no  fever.with no fever, chills, night sweats or weight loss. Onset of symptoms was about a week ago and gradually worsening.She is drinking plenty of fluids.  Past history is significant for no history of pneumonia or bronchitis. Patient is non-smoker. She reports her husband was COVID positive on 05/30/22.   ---------------------------------------------------------------------------------------------------   Medications: Outpatient Medications Prior to Visit  Medication Sig   metoprolol succinate (TOPROL-XL) 25 MG 24 hr tablet Take 1 tablet by mouth daily.   Multiple Vitamins-Minerals (PRESERVISION AREDS PO) Take by mouth.   No facility-administered medications prior to visit.    Review of Systems  Constitutional:  Negative for appetite change, chills and fever.  HENT:  Positive for congestion, nosebleeds and rhinorrhea.   Respiratory:  Positive for cough. Negative for chest tightness, shortness of breath and wheezing.   Gastrointestinal:  Negative for abdominal pain, nausea and vomiting.       Objective    BP 125/60 (BP Location: Left Arm, Patient Position: Sitting, Cuff Size: Large)   Pulse 69   Temp 98.1 F (36.7 C) (Temporal)   Resp 12   Wt 196 lb 6.4 oz (89.1 kg)   SpO2 96%   BMI 34.24 kg/m    Physical Exam Vitals reviewed.  Constitutional:      General: She is  not in acute distress.    Appearance: Normal appearance. She is well-developed. She is not diaphoretic.  HENT:     Head: Normocephalic and atraumatic.     Right Ear: Tympanic membrane, ear canal and external ear normal.     Left Ear: Tympanic membrane, ear canal and external ear normal.     Nose: Nose normal.     Mouth/Throat:     Mouth: Mucous membranes are moist.     Pharynx: Oropharynx is clear. No oropharyngeal exudate.  Eyes:     General: No scleral icterus.    Conjunctiva/sclera: Conjunctivae normal.     Pupils: Pupils are equal, round, and reactive to light.  Neck:     Thyroid: No thyromegaly.  Cardiovascular:     Rate and Rhythm: Normal rate and regular rhythm.     Heart sounds: Normal heart sounds. No murmur heard. Pulmonary:     Effort: Pulmonary effort is normal. No respiratory distress.     Breath sounds: Normal breath sounds. No wheezing or rales.  Musculoskeletal:     Cervical back: Neck supple.     Right lower leg: No edema.     Left lower leg: No edema.  Lymphadenopathy:     Cervical: No cervical adenopathy.  Skin:    General: Skin is warm and dry.     Findings: No rash.  Neurological:     Mental Status: She is alert and oriented to person, place, and time. Mental status is at baseline.       Results for orders placed or performed in visit  on 06/18/22  POC COVID-19  Result Value Ref Range   SARS Coronavirus 2 Ag Negative Negative  POCT Influenza A/B  Result Value Ref Range   Influenza A, POC Negative Negative   Influenza B, POC Negative Negative    Assessment & Plan     1. Viral URI with cough - symptoms and exam c/w viral URI - no evidence of strep pharyngitis, CAP, AOM, bacterial sinusitis, or other bacterial infection - discussed symptomatic management, natural course, and return precautions  - POC COVID-19 - POCT Influenza A/B   Meds ordered this encounter  Medications   guaiFENesin-codeine 100-10 MG/5ML syrup    Sig: Take 5 mLs by mouth  3 (three) times daily as needed for cough.    Dispense:  120 mL    Refill:  0     Return for as scheduled.      I, Lavon Paganini, MD, have reviewed all documentation for this visit. The documentation on 06/18/22 for the exam, diagnosis, procedures, and orders are all accurate and complete.   Addasyn Mcbreen, Dionne Bucy, MD, MPH [facility]

## 2022-06-19 ENCOUNTER — Ambulatory Visit: Payer: Self-pay | Admitting: *Deleted

## 2022-06-19 DIAGNOSIS — B301 Conjunctivitis due to adenovirus: Secondary | ICD-10-CM | POA: Diagnosis not present

## 2022-06-19 NOTE — Telephone Encounter (Signed)
  Chief Complaint: Saw Dr. Brita Romp yesterday 2/29 for viral URI.   The cough medication she prescribed is out of stock for 20 miles around.   Is there something else she can prescribe for the cough? She thinks she may have pink eye.   Right eye right and watering after seeing the eye dr for her annual exam.   She has contacted him and he is seeing her for the eye issue. Symptoms: Coughing but cough medicine is out of stock.     Frequency: N/A Pertinent Negatives: Patient denies being able to get the cough medicine due to it being out of stock. Disposition: '[]'$ ED /'[]'$ Urgent Care (no appt availability in office) / '[x]'$ Appointment(In office/virtual)/ '[]'$  [place] [facility]/ '[]'$ Home Care/ '[]'$ Refused Recommended Disposition /'[]'$ [facility] [facility]/ '[x]'$  Follow-up with PCP Additional Notes: She has contacted her eye dr and he is seeing her about the pink eye, red and watering today.  She is needing another cough medication called in because the one Dr. Brita Romp prescribed for her yesterday is out of stock for 20 miles around.

## 2022-06-19 NOTE — Telephone Encounter (Signed)
Message from [name] sent at 06/19/2022  8:52 AM EST  Summary: possible pink eye and cold symptoms   Pt thinks she has pink eye / pt also mentioned she thinks she needs a zpack for the issue she was seen for this week / please advise          Call History   Type Contact Phone/Fax User  06/19/2022 08:51 AM EST Phone (Incoming) [name], [place] H (Self) [phone] Jerilynn Mages) [name]   Reason for Disposition  Eyelid is red and painful (or tender to touch)    Right eye is watering a lot.   It's also red.   Left eye is a little red with mild watering.   She has contacted her eye dr and he is going to see her for this.  Answer Assessment - Initial Assessment Questions 1. EYE DISCHARGE: "Is the discharge in one or both eyes?" "What color is it?" "How much is there?" "When did the discharge start?"      I think I have pink eye.  I went to the eye dr. And he put drops in my eyes to dilate them.  Now a day or two later My right eye started watering real bad and turning red.   My left eye is a little red but the right is worse.    I don't know if the drops have something to do with this or what but it happened right after seeing my eye dr for my check up.    I have called my eye dr and they are going to see me for this. I'm calling because I saw Dr. Brita Romp yesterday and she told me I had a viral infection.   My Covid and flu tests were negative. [facility] not [facility] she prescribed for me yesterday.    Cough medicine not in stock for 20 miles.       I'm using cough drops.  She told me I could use honey for the coughing. She told me it would take about 18 days for this virus to run its course and that antibiotics would not help because it's a virus.  2. REDNESS OF SCLERA: "Is the redness in one or both eyes?" "When did the redness start?"      Both eyes but the right is worse with the watering and redness. 3. EYELIDS: "Are the eyelids red or swollen?" If Yes, ask: "How  much?"      No 4. VISION: "Is there any difficulty seeing clearly?"      Watering a lot in my right eye. 5. PAIN: "Is there any pain? If Yes, ask: "How bad is it?" (Scale 1-10; or mild, moderate, severe)    - MILD (1-3): doesn't interfere with normal activities     - MODERATE (4-7): interferes with normal activities or awakens from sleep    - SEVERE (8-10): excruciating pain, unable to do any normal activities       No 6. CONTACT LENS: "Do you wear contacts?"     Not asked 7. OTHER SYMPTOMS: "Do you have any other symptoms?" (e.g., fever, runny nose, cough)     Diagnosed with URI yesterday that is viral.   Given cough medication with codeine but it is out of stock for 20 miles around.  None of the drug stores have it. 8. PREGNANCY: "Is there any chance you are pregnant?" "When was your last menstrual period?"     N/A due to  age  Protocols used: Eye - Pus or [place]

## 2022-06-22 MED ORDER — BENZONATATE 200 MG PO CAPS: 200.0000 mg | ORAL_CAPSULE | Freq: Two times a day (BID) | ORAL | 0 refills | Status: DC | PRN

## 2022-06-22 NOTE — Telephone Encounter (Signed)
Patient advised.

## 2022-06-22 NOTE — Addendum Note (Signed)
Addended by: Virginia Crews on: 06/22/2022 11:36 AM   Modules accepted: Orders

## 2022-07-07 DIAGNOSIS — E7849 Other hyperlipidemia: Secondary | ICD-10-CM | POA: Diagnosis not present

## 2022-07-07 DIAGNOSIS — I479 Paroxysmal tachycardia, unspecified: Secondary | ICD-10-CM | POA: Diagnosis not present

## 2022-07-07 DIAGNOSIS — I38 Endocarditis, valve unspecified: Secondary | ICD-10-CM | POA: Diagnosis not present

## 2022-07-07 DIAGNOSIS — I480 Paroxysmal atrial fibrillation: Secondary | ICD-10-CM | POA: Diagnosis not present

## 2022-07-07 DIAGNOSIS — I10 Essential (primary) hypertension: Secondary | ICD-10-CM | POA: Diagnosis not present

## 2022-07-07 DIAGNOSIS — I517 Cardiomegaly: Secondary | ICD-10-CM | POA: Diagnosis not present

## 2022-07-29 DIAGNOSIS — I480 Paroxysmal atrial fibrillation: Secondary | ICD-10-CM | POA: Diagnosis not present

## 2022-07-29 DIAGNOSIS — I517 Cardiomegaly: Secondary | ICD-10-CM | POA: Diagnosis not present

## 2022-07-29 DIAGNOSIS — I479 Paroxysmal tachycardia, unspecified: Secondary | ICD-10-CM | POA: Diagnosis not present

## 2022-07-29 DIAGNOSIS — I38 Endocarditis, valve unspecified: Secondary | ICD-10-CM | POA: Diagnosis not present

## 2022-08-17 DIAGNOSIS — E7849 Other hyperlipidemia: Secondary | ICD-10-CM | POA: Diagnosis not present

## 2022-08-21 DIAGNOSIS — E7849 Other hyperlipidemia: Secondary | ICD-10-CM | POA: Diagnosis not present

## 2022-08-21 DIAGNOSIS — I340 Nonrheumatic mitral (valve) insufficiency: Secondary | ICD-10-CM | POA: Diagnosis not present

## 2022-08-21 DIAGNOSIS — I10 Essential (primary) hypertension: Secondary | ICD-10-CM | POA: Diagnosis not present

## 2022-08-21 DIAGNOSIS — I517 Cardiomegaly: Secondary | ICD-10-CM | POA: Diagnosis not present

## 2022-08-21 DIAGNOSIS — I480 Paroxysmal atrial fibrillation: Secondary | ICD-10-CM | POA: Diagnosis not present

## 2022-09-04 ENCOUNTER — Ambulatory Visit
Admission: RE | Admit: 2022-09-04 | Discharge: 2022-09-04 | Disposition: A | Discharge status: Home / Self Care | Payer: Medicare PPO | Source: Ambulatory Visit | Attending: Family Medicine | Admitting: Family Medicine

## 2022-09-04 DIAGNOSIS — Z1231 Encounter for screening mammogram for malignant neoplasm of breast: Secondary | ICD-10-CM | POA: Insufficient documentation

## 2023-02-23 ENCOUNTER — Other Ambulatory Visit
Admission: RE | Admit: 2023-02-23 | Discharge: 2023-02-23 | Disposition: A | Discharge status: Home / Self Care | Payer: Medicare PPO | Source: Ambulatory Visit | Attending: Nurse Practitioner | Admitting: Nurse Practitioner

## 2023-02-23 DIAGNOSIS — Z6833 Body mass index (BMI) 33.0-33.9, adult: Secondary | ICD-10-CM | POA: Diagnosis not present

## 2023-02-23 DIAGNOSIS — R600 Localized edema: Secondary | ICD-10-CM | POA: Insufficient documentation

## 2023-02-23 DIAGNOSIS — E66811 Obesity, class 1: Secondary | ICD-10-CM | POA: Diagnosis not present

## 2023-02-23 DIAGNOSIS — M79604 Pain in right leg: Secondary | ICD-10-CM | POA: Insufficient documentation

## 2023-02-23 DIAGNOSIS — M79605 Pain in left leg: Secondary | ICD-10-CM | POA: Diagnosis not present

## 2023-02-23 DIAGNOSIS — I119 Hypertensive heart disease without heart failure: Secondary | ICD-10-CM | POA: Insufficient documentation

## 2023-02-23 DIAGNOSIS — E7849 Other hyperlipidemia: Secondary | ICD-10-CM | POA: Insufficient documentation

## 2023-02-23 DIAGNOSIS — R0609 Other forms of dyspnea: Secondary | ICD-10-CM | POA: Diagnosis present

## 2023-02-23 DIAGNOSIS — I10 Essential (primary) hypertension: Secondary | ICD-10-CM | POA: Diagnosis not present

## 2023-02-23 DIAGNOSIS — M791 Myalgia, unspecified site: Secondary | ICD-10-CM | POA: Diagnosis not present

## 2023-02-23 DIAGNOSIS — I479 Paroxysmal tachycardia, unspecified: Secondary | ICD-10-CM | POA: Insufficient documentation

## 2023-02-23 DIAGNOSIS — M797 Fibromyalgia: Secondary | ICD-10-CM | POA: Insufficient documentation

## 2023-02-23 DIAGNOSIS — I480 Paroxysmal atrial fibrillation: Secondary | ICD-10-CM | POA: Diagnosis not present

## 2023-02-23 LAB — CK: Total CK: 98 U/L (ref 38–234)

## 2023-02-23 LAB — BRAIN NATRIURETIC PEPTIDE: B Natriuretic Peptide: 129.5 pg/mL — ABNORMAL HIGH (ref 0.0–100.0)

## 2023-03-03 DIAGNOSIS — L538 Other specified erythematous conditions: Secondary | ICD-10-CM | POA: Diagnosis not present

## 2023-03-03 DIAGNOSIS — D2271 Melanocytic nevi of right lower limb, including hip: Secondary | ICD-10-CM | POA: Diagnosis not present

## 2023-03-03 DIAGNOSIS — L738 Other specified follicular disorders: Secondary | ICD-10-CM | POA: Diagnosis not present

## 2023-03-03 DIAGNOSIS — D2261 Melanocytic nevi of right upper limb, including shoulder: Secondary | ICD-10-CM | POA: Diagnosis not present

## 2023-03-03 DIAGNOSIS — B078 Other viral warts: Secondary | ICD-10-CM | POA: Diagnosis not present

## 2023-03-03 DIAGNOSIS — D2272 Melanocytic nevi of left lower limb, including hip: Secondary | ICD-10-CM | POA: Diagnosis not present

## 2023-03-03 DIAGNOSIS — D2262 Melanocytic nevi of left upper limb, including shoulder: Secondary | ICD-10-CM | POA: Diagnosis not present

## 2023-03-03 DIAGNOSIS — D225 Melanocytic nevi of trunk: Secondary | ICD-10-CM | POA: Diagnosis not present

## 2023-03-09 IMAGING — CR DG CHEST 2V
2 series · 2 of 2 positions shown · non-contrast
Comparison: September 20, 2017

CLINICAL DATA: Irregular heart rate.

EXAM:
CHEST - 2 VIEW

[chest pa]
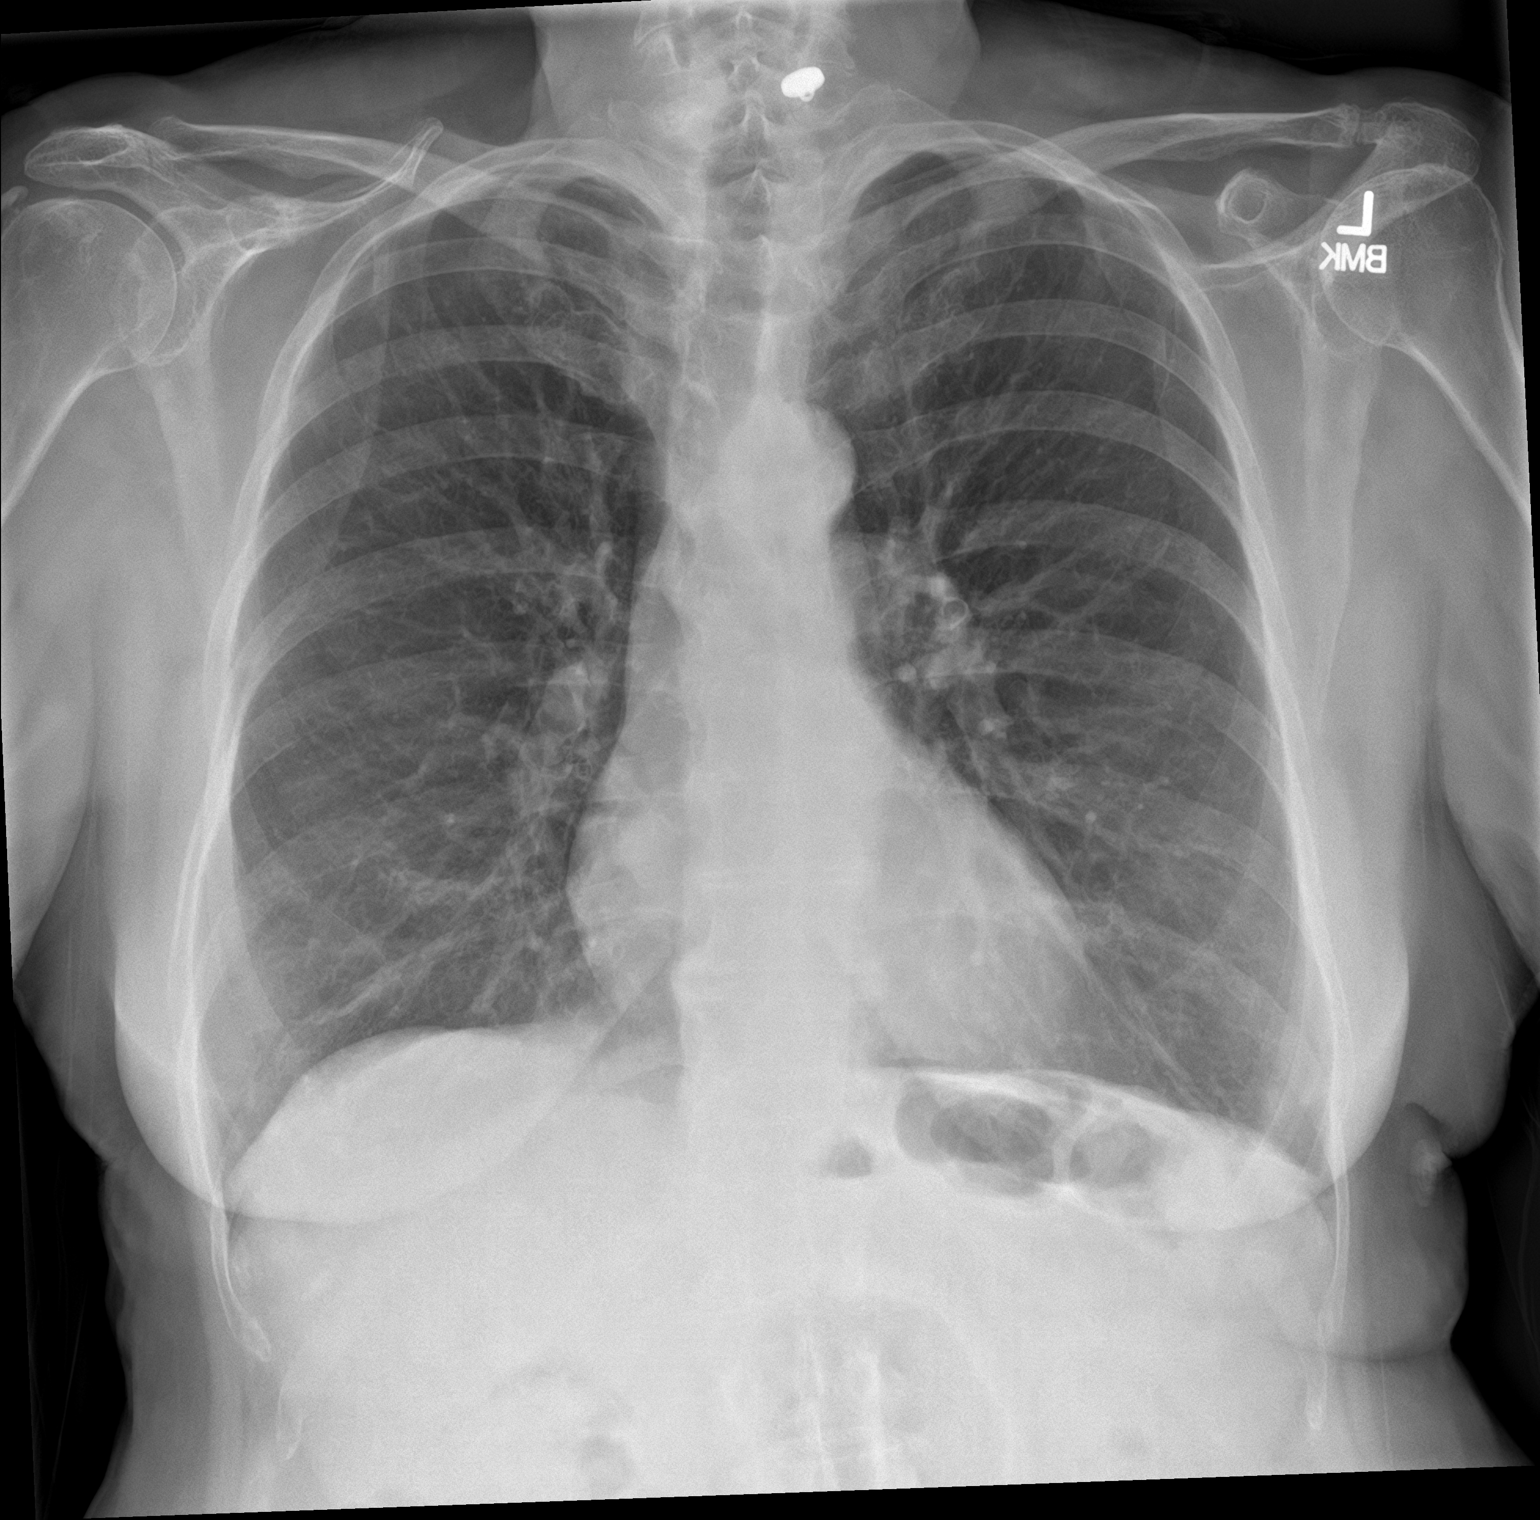

[chest lat]
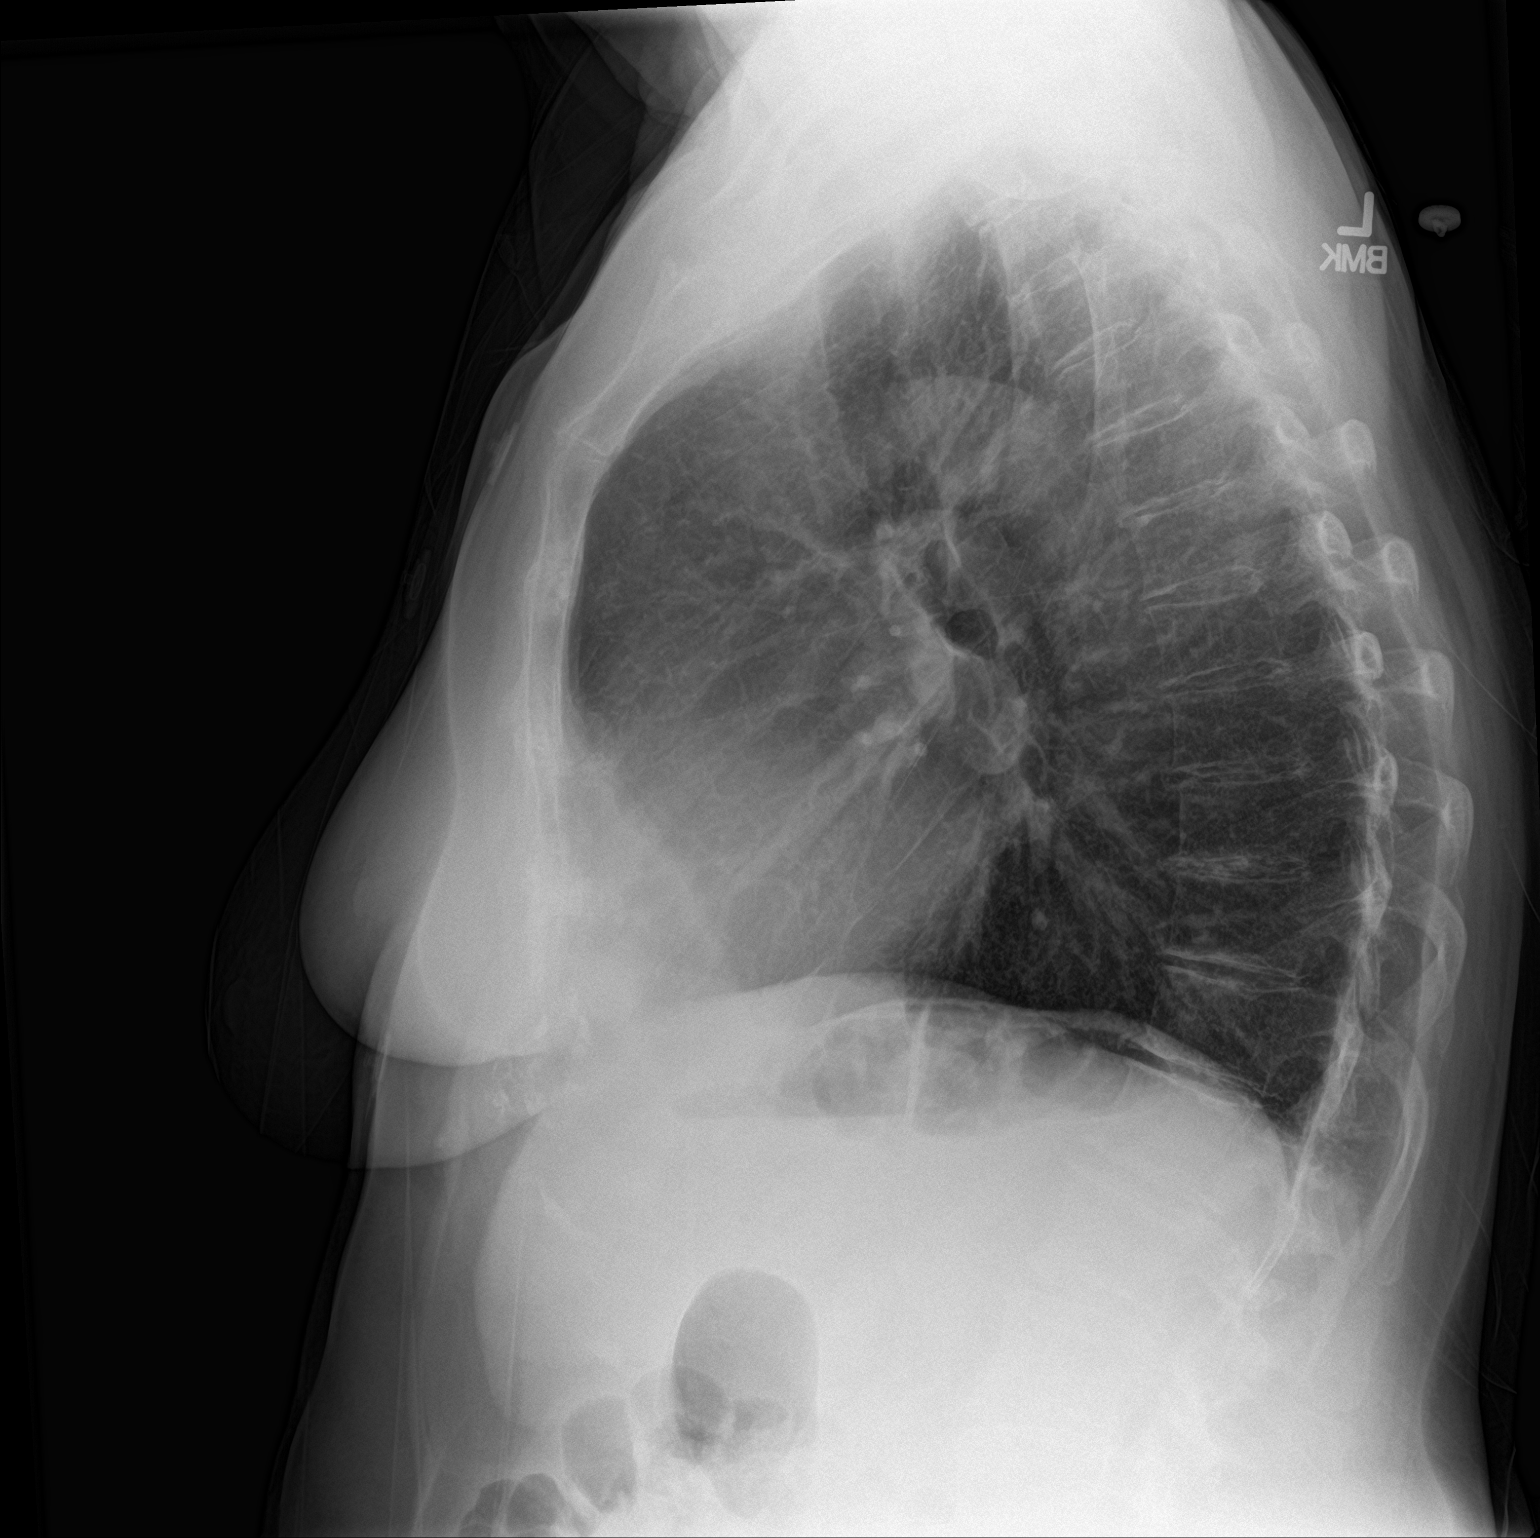

[2 of 2 positions shown; findings below may reference images not displayed]

FINDINGS: The heart size and mediastinal contours are within normal limits.
Both lungs are clear. The visualized skeletal structures are
unremarkable.
IMPRESSION: No active cardiopulmonary disease.

## 2023-04-29 ENCOUNTER — Ambulatory Visit (INDEPENDENT_AMBULATORY_CARE_PROVIDER_SITE_OTHER): Payer: Medicare PPO | Admitting: Nurse Practitioner

## 2023-04-29 ENCOUNTER — Encounter (INDEPENDENT_AMBULATORY_CARE_PROVIDER_SITE_OTHER): Payer: Self-pay | Admitting: Nurse Practitioner

## 2023-04-29 VITALS — BP 172/92 | HR 63 | Resp 18 | Ht 65.0 in | Wt 202.2 lb

## 2023-04-29 DIAGNOSIS — M7661 Achilles tendinitis, right leg: Secondary | ICD-10-CM

## 2023-04-29 DIAGNOSIS — M79605 Pain in left leg: Secondary | ICD-10-CM | POA: Diagnosis not present

## 2023-04-29 DIAGNOSIS — M79604 Pain in right leg: Secondary | ICD-10-CM | POA: Diagnosis not present

## 2023-04-29 DIAGNOSIS — M7662 Achilles tendinitis, left leg: Secondary | ICD-10-CM | POA: Diagnosis not present

## 2023-04-29 NOTE — Progress Notes (Signed)
 Subjective:    Patient ID: Melissa Barry, female    DOB: Mar 17, 1946, 78 y.o.   MRN: [id number] Chief Complaint  Patient presents with   New Patient (Initial Visit)    NP. consult. BLE pain/swelling. parrish    Melissa Barry is a 78 year old female who presents today for evaluation of bilateral lower extremity edema as well as pain and discomfort.  The patient has notable swelling in her lower extremities with the left foot being worse than the right.  She endorses having pain in her lower extremities with activity.  Based on her description of symptoms sound somewhat concerning for possible claudication but further evaluation also increases suspicion for possible musculoskeletal issue currently she has no open wounds or ulcerations.  She has some spider varicosities bilaterally.  She is elevating her legs which is slightly helpful she has also worn compression socks which she finds to be extremely uncomfortable.  Her largest issue is of pain in her left foot.  She notes that when she stands on the left foot is extremely painful to the point to where she can barely put any pressure on it.  When she actually starts walking he eases up but it still continues to have pain and discomfort.  This is located in the heel area and radiates up towards the arch area.    Review of Systems  Cardiovascular:  Positive for leg swelling.  Musculoskeletal:  Positive for arthralgias.  All other systems reviewed and are negative.      Objective:   Physical Exam Vitals reviewed.  HENT:     Head: Normocephalic.  Cardiovascular:     Rate and Rhythm: Normal rate.     Pulses:          Dorsalis pedis pulses are 1+ on the right side and 2+ on the left side.  Pulmonary:     Effort: Pulmonary effort is normal.  Musculoskeletal:     Left lower leg: 1+ Edema present.  Skin:    General: Skin is warm and dry.  Neurological:     Mental Status: She is alert and oriented to person, place, and time.  Psychiatric:         Mood and Affect: Mood normal.        Behavior: Behavior normal.        Thought Content: Thought content normal.        Judgment: Judgment normal.     BP (!) 172/92   Pulse 63   Resp 18   Ht 5' 5 (1.651 m)   Wt 202 lb 3.2 oz (91.7 kg)   BMI 33.65 kg/m   Past Medical History:  Diagnosis Date   Medical history non-contributory    Primary hypertension 10/31/2020    Social History   Socioeconomic History   Marital status: Married    Spouse name: Not on file   Number of children: 2   Years of education: Not on file   Highest education level: Some college, no degree  Occupational History   Occupation: retired  Tobacco Use   Smoking status: Former    Types: Cigarettes   Smokeless tobacco: Never   Tobacco comments:    only smoked in [employer]   Vaping status: Never Used  Substance and Sexual Activity   Alcohol use: Yes    Alcohol/week: 0.0 - 4.0 standard drinks of alcohol    Comment: a couple drinks a month   Drug use: No   Sexual activity: Not  on file  Other Topics Concern   Not on file  Social History Narrative   Not on file   Social Drivers of Health   Financial Resource Strain: Low Risk  (03/07/2020)   Overall Financial Resource Strain (CARDIA)    Difficulty of Paying Living Expenses: Not hard at all  Food Insecurity: No Food Insecurity (03/07/2020)   Hunger Vital Sign    Worried About Running Out of Food in the Last Year: Never true    Ran Out of Food in the Last Year: Never true  Transportation Needs: No Transportation Needs (03/07/2020)   PRAPARE - Administrator, Civil Service (Medical): No    Lack of Transportation (Non-Medical): No  Physical Activity: Inactive (03/07/2020)   Exercise Vital Sign    Days of Exercise per Week: 0 days    Minutes of Exercise per Session: 0 min  Stress: No Stress Concern Present (03/07/2020)   [employer] of Occupational Health - Occupational Stress Questionnaire    Feeling of Stress :  Not at all  Social Connections: Moderately Isolated (03/07/2020)   Social Connection and Isolation Panel [NHANES]    Frequency of Communication with Friends and Family: More than three times a week    Frequency of Social Gatherings with Friends and Family: Three times a week    Attends Religious Services: Never    Active Member of Clubs or Organizations: No    Attends Banker Meetings: Never    Marital Status: Married  Catering Manager Violence: Not At Risk (03/07/2020)   Humiliation, Afraid, Rape, and Kick questionnaire    Fear of Current or Ex-Partner: No    Emotionally Abused: No    Physically Abused: No    Sexually Abused: No    Past Surgical History:  Procedure Laterality Date   BREAST BIOPSY Left years ago   Dr. Dessa    CATARACT EXTRACTION W/PHACO Right 10/29/2020   Procedure: CATARACT EXTRACTION PHACO AND INTRAOCULAR LENS PLACEMENT (IOC) RIGHT VIVITY LENS 8.96 01:00.6;  Surgeon: Jaye Fallow, MD;  Location: [facility];  Service: Ophthalmology;  Laterality: Right;  DEXTENZA    CATARACT EXTRACTION W/PHACO Left 11/12/2020   Procedure: CATARACT EXTRACTION PHACO AND INTRAOCULAR LENS PLACEMENT (IOC) LEFT VIVITY LENS 4.73 00:31.0;  Surgeon: Jaye Fallow, MD;  Location: [facility];  Service: Ophthalmology;  Laterality: Left;  DEXTENZA    COLONOSCOPY WITH PROPOFOL  N/A 12/17/2015   Procedure: COLONOSCOPY WITH PROPOFOL ;  Surgeon: Rogelia Copping, MD;  Location: [facility];  Service: Endoscopy;  Laterality: N/A;   DILATION AND CURETTAGE OF UTERUS     EYE SURGERY Right    cataract   TONSILLECTOMY AND ADENOIDECTOMY      Family History  Problem Relation Age of Onset   Ovarian cancer Mother    Heart disease Father    Lung cancer Father    Thyroid  disease Father    Congestive Heart Failure Father    Hepatitis Sister    Celiac disease Sister    Migraines Daughter    Migraines Son    Sarcoidosis Son    Parkinson's disease Maternal Grandfather     Stroke Paternal Grandmother    Lung cancer Paternal Grandfather     Allergies  Allergen Reactions   Doxycycline Other (See Comments)    Made her feel sick the whole time she was taking it.    Penicillins Rash   Sulfa Antibiotics Swelling and Rash    Patient states throat swelling  Latest Ref Rng & Units 05/08/2021   12:02 AM 09/20/2017    9:09 PM 02/24/2017    8:53 AM  CBC  WBC 4.0 - 10.5 K/uL 6.5  6.0  5.2   Hemoglobin 12.0 - 15.0 g/dL 85.3  84.9  85.1   Hematocrit 36.0 - 46.0 % 43.5  43.6  43.6   Platelets 150 - 400 K/uL 218  204  229       CMP     Component Value Date/Time   NA 141 05/21/2022 0932   K 4.8 05/21/2022 0932   CL 104 05/21/2022 0932   CO2 24 05/21/2022 0932   GLUCOSE 93 05/21/2022 0932   GLUCOSE 133 (H) 05/08/2021 0002   BUN 16 05/21/2022 0932   CREATININE 0.84 05/21/2022 0932   CREATININE 0.76 02/24/2017 0853   CALCIUM 9.9 05/21/2022 0932   PROT 6.6 05/21/2022 0932   ALBUMIN 4.7 05/21/2022 0932   AST 16 05/21/2022 0932   ALT 11 05/21/2022 0932   ALKPHOS 81 05/21/2022 0932   BILITOT 0.7 05/21/2022 0932   EGFR 73 05/21/2022 0932   GFRNONAA >60 05/08/2021 0002   GFRNONAA 80 02/24/2017 0853     No results found.     Assessment & Plan:   1. Achilles tendinitis of both lower extremities (Primary) I suspect that the patient's foot pain is plantar fasciitis.  We will send the patient to podiatry for further workup and evaluation.  I do believe that a large portion of the pain and discomfort she has with ambulation is related to this pain. - Ambulatory referral to Podiatry  2. Leg pain, bilateral  Recommend:  The patient has atypical pain symptoms for pure atherosclerotic disease. However, on physical exam there is evidence of vascular disease, given the diminished pulses and the edema of the legs.  Further investigation of the patient's vascular disease is necessary to determine the relationship of the patient's lower extremity symptoms  and the degree of vascular disease.  Noninvasive studies of the will be obtained and the patient will follow up with me to review these studies.  Use of medical grade compression is recommended as well.   The patient should continue walking and begin a more formal exercise program. The patient should continue his antiplatelet therapy and aggressive treatment of the lipid abnormalities.    Current Outpatient Medications on File Prior to Visit  Medication Sig Dispense Refill   atorvastatin (LIPITOR) 40 MG tablet Take 40 mg by mouth daily.     metoprolol succinate (TOPROL-XL) 25 MG 24 hr tablet Take 1 tablet by mouth daily.     Multiple Vitamins-Minerals (PRESERVISION AREDS PO) Take by mouth.     benzonatate  (TESSALON ) 200 MG capsule Take 1 capsule (200 mg total) by mouth 2 (two) times daily as needed for cough. 20 capsule 0   No current facility-administered medications on file prior to visit.    There are no Patient Instructions on file for this visit. No follow-ups on file.   Shepherd Finnan E Inge Waldroup, NP

## 2023-05-24 ENCOUNTER — Encounter: Payer: Self-pay | Admitting: Family Medicine

## 2023-05-24 ENCOUNTER — Ambulatory Visit: Payer: Medicare PPO | Admitting: Family Medicine

## 2023-05-24 VITALS — BP 151/71 | HR 63 | Ht 65.0 in | Wt 203.5 lb

## 2023-05-24 DIAGNOSIS — E7800 Pure hypercholesterolemia, unspecified: Secondary | ICD-10-CM | POA: Diagnosis not present

## 2023-05-24 DIAGNOSIS — R739 Hyperglycemia, unspecified: Secondary | ICD-10-CM

## 2023-05-24 DIAGNOSIS — I10 Essential (primary) hypertension: Secondary | ICD-10-CM | POA: Diagnosis not present

## 2023-05-24 DIAGNOSIS — I480 Paroxysmal atrial fibrillation: Secondary | ICD-10-CM | POA: Diagnosis not present

## 2023-05-24 DIAGNOSIS — Z0000 Encounter for general adult medical examination without abnormal findings: Secondary | ICD-10-CM

## 2023-05-24 DIAGNOSIS — Z1231 Encounter for screening mammogram for malignant neoplasm of breast: Secondary | ICD-10-CM

## 2023-05-24 DIAGNOSIS — Z0001 Encounter for general adult medical examination with abnormal findings: Secondary | ICD-10-CM | POA: Diagnosis not present

## 2023-05-24 NOTE — Progress Notes (Signed)
Annual Wellness Visit     Patient: Melissa Barry, Female    DOB: 25-May-1945, 78 y.o.   MRN: [id number] Visit Date: 05/24/2023  Today's Provider: Shirlee Latch, MD   Chief Complaint  Patient presents with   Annual Exam    Last completed 05/21/2022 Diet - General, well balanced Exercise -none Feeling - well  Sleeping - fairly well due to having to go to restroom Patient refused to complete 6CIT   Subjective    Melissa Barry is a 78 y.o. female who presents today for her Annual Wellness Visit.  Discussed the use of AI scribe software for clinical note transcription with the patient, who gave verbal consent to proceed.  History of Present Illness   The patient, with a history of high cholesterol and leg problems, presents for a wellness visit and physical. They report having been prescribed atorvastatin 40mg  by their previous cardiologist, which significantly reduced their cholesterol levels. However, they have been experiencing leg problems, which they initially thought might be a side effect of the medication. After a brief period of discontinuation and no improvement in their leg symptoms, they have self-adjusted the dose to 20mg . They are scheduled to see a podiatrist for suspected plantar fasciitis and a cardiologist for further management of their cholesterol levels. The patient also mentions a history of atrial fibrillation, for which they are on metoprolol. They have not taken their medication on the day of the visit, which might explain the elevated blood pressure reading. The patient also expresses concern about weight gain and difficulty with mobility, particularly after periods of stillness.            Medications: Outpatient Medications Prior to Visit  Medication Sig   atorvastatin (LIPITOR) 40 MG tablet Take 20 mg by mouth daily.   metoprolol succinate (TOPROL-XL) 25 MG 24 hr tablet Take 1 tablet by mouth daily.   Multiple Vitamins-Minerals (PRESERVISION  AREDS PO) Take by mouth.   [DISCONTINUED] benzonatate (TESSALON) 200 MG capsule Take 1 capsule (200 mg total) by mouth 2 (two) times daily as needed for cough.   No facility-administered medications prior to visit.    Allergies  Allergen Reactions   Doxycycline Other (See Comments)    Made her feel sick the whole time she was taking it.    Penicillins Rash   Sulfa Antibiotics Swelling and Rash    Patient states throat swelling     Patient Care Team: Erasmo Downer, MD as PCP - General (Family Medicine) Juanell Fairly, MD as Referring Physician (Orthopedic Surgery) Galen Manila, MD as Referring Physician (Ophthalmology) Debbrah Alar, MD (Dermatology)  Review of Systems       Objective    Vitals: BP (!) 151/71 (Cuff Size: Normal)   Pulse 63   Ht 5\' 5"  (1.651 m)   Wt 203 lb 8 oz (92.3 kg)   SpO2 99%   BMI 33.86 kg/m      Physical Exam Vitals reviewed.  Constitutional:      General: She is not in acute distress.    Appearance: Normal appearance. She is well-developed. She is not diaphoretic.  HENT:     Head: Normocephalic and atraumatic.     Right Ear: Tympanic membrane, ear canal and external ear normal.     Left Ear: Tympanic membrane, ear canal and external ear normal.     Nose: Nose normal.     Mouth/Throat:     Mouth: Mucous membranes are moist.  Pharynx: Oropharynx is clear. No oropharyngeal exudate.  Eyes:     General: No scleral icterus.    Conjunctiva/sclera: Conjunctivae normal.     Pupils: Pupils are equal, round, and reactive to light.  Neck:     Thyroid: No thyromegaly.  Cardiovascular:     Rate and Rhythm: Normal rate and regular rhythm.     Heart sounds: Normal heart sounds. No murmur heard. Pulmonary:     Effort: Pulmonary effort is normal. No respiratory distress.     Breath sounds: Normal breath sounds. No wheezing or rales.  Abdominal:     General: There is no distension.     Palpations: Abdomen is soft.      Tenderness: There is no abdominal tenderness.  Musculoskeletal:        General: No deformity.     Cervical back: Neck supple.     Right lower leg: Edema present.     Left lower leg: Edema present.  Lymphadenopathy:     Cervical: No cervical adenopathy.  Skin:    General: Skin is warm and dry.     Findings: No rash.  Neurological:     Mental Status: She is alert and oriented to person, place, and time. Mental status is at baseline.  Psychiatric:        Mood and Affect: Mood normal.        Behavior: Behavior normal.        Thought Content: Thought content normal.     Most recent functional status assessment:    05/24/2023    8:49 AM  In your present state of health, do you have any difficulty performing the following activities:  Hearing? 0  Vision? 0  Difficulty concentrating or making decisions? 0  Walking or climbing stairs? 0  Dressing or bathing? 0  Doing errands, shopping? 0   Most recent fall risk assessment:    05/21/2023   10:50 AM  Fall Risk   Falls in the past year? 0    Most recent depression screenings:    05/24/2023    8:52 AM 06/18/2022   10:21 AM  PHQ 2/9 Scores  PHQ - 2 Score 0 0  PHQ- 9 Score  0   Most recent cognitive screening:    05/24/2023    8:50 AM  6CIT Screen  What Year? 0 points  What month? 0 points   Most recent Audit-C alcohol use screening    05/21/2023   10:56 AM  Alcohol Use Disorder Test (AUDIT)  1. How often do you have a drink containing alcohol? 2  2. How many drinks containing alcohol do you have on a typical day when you are drinking? 0  3. How often do you have six or more drinks on one occasion? 0  AUDIT-C Score 2      Patient-reported   A score of 3 or more in women, and 4 or more in men indicates increased risk for alcohol abuse, EXCEPT if all of the points are from question 1   No results found.  No results found for any visits on 05/24/23.  Assessment & Plan     Annual wellness visit done today including the  all of the following: Reviewed patient's Family Medical History Reviewed and updated list of patient's medical providers Assessment of cognitive impairment was done Assessed patient's functional ability Established a written schedule for health screening services Health Risk Assessent Completed and Reviewed  Exercise Activities and Dietary recommendations  Goals  Exercise 3x per week (30 min per time)     Recommend doing some form of exercise for 3 days a week for at least 30 minutes.   02/24/18, Continue trying to walk or exercise for 3 days a week for at least 30 mins a day.      LIFESTYLE - DECREASE FALLS RISK     Recommend to remove any items from the home that may cause slips or trips.        Immunization History  Administered Date(s) Administered   Moderna Covid-19 Vaccine Bivalent Booster 39yrs & up 02/18/2021   PFIZER(Purple Top)SARS-COV-2 Vaccination 06/03/2019, 06/24/2019, 01/25/2020   Pneumococcal Conjugate-13 09/24/2015   Pneumococcal Polysaccharide-23 02/03/2013   Td 06/24/2010   Tdap 06/24/2010, 06/12/2021    Health Maintenance  Topic Date Due   Zoster Vaccines- Shingrix (1 of 2) Never done   COVID-19 Vaccine (5 - 2024-25 season) 12/20/2022   INFLUENZA VACCINE  07/19/2023 (Originally 11/19/2022)   Medicare Annual Wellness (AWV)  05/23/2024   Colonoscopy  12/16/2025   DTaP/Tdap/Td (4 - Td or Tdap) 06/13/2031   Pneumonia Vaccine 63+ Years old  Completed   DEXA SCAN  Completed   Hepatitis C Screening  Completed   HPV VACCINES  Aged Out     Discussed health benefits of physical activity, and encouraged her to engage in regular exercise appropriate for her age and condition.    Problem List Items Addressed This Visit       Cardiovascular and Mediastinum   Paroxysmal atrial fibrillation (HCC)   Primary hypertension   Relevant Orders   Comprehensive metabolic panel     Other   Hypercholesterolemia   Relevant Orders   Comprehensive metabolic panel    Lipid panel   Other Visit Diagnoses       Encounter for annual wellness visit (AWV) in Medicare patient    -  Primary     Encounter for annual physical exam       Relevant Orders   Hemoglobin A1c   Comprehensive metabolic panel   Lipid panel     Hyperglycemia       Relevant Orders   Hemoglobin A1c     Breast cancer screening by mammogram       Relevant Orders   MM 3D SCREENING MAMMOGRAM BILATERAL BREAST           Atrial Fibrillation, Hypertension Atrial fibrillation managed with metoprolol. Elevated blood pressure during visit; medication not taken that morning. Advised to take medication before next cardiology visit. - Recheck blood pressure - Ensure medication adherence before cardiology visit  Hyperlipidemia Hyperlipidemia managed with atorvastatin. Reduced dose from 40 mg to 20 mg due to leg pain, no significant improvement. Discussed potential switch to rosuvastatin due to side effects. Patient concerned about side effects based on others' experiences. Informed about potential benefits and fewer side effects of rosuvastatin. - Order lipid panel - Consider switching to rosuvastatin if side effects persist  Arthritis Reports stiffness and difficulty moving after inactivity, consistent with arthritis. Symptoms exacerbated by prolonged sitting. Discussed importance of regular movement and stretching exercises. - Encourage regular movement and stretching exercises  General Health Maintenance Up to date on most vaccinations and screenings. Declined flu and shingles vaccines. Next tetanus shot due in 2033. Last colonoscopy in 2017 was normal, aged out of screening colonoscopies. Mammogram due in May 2025. - Order mammogram for May 2025 - Order comprehensive metabolic panel - Order A1c to monitor for diabetes  Return in about 1 year (around 05/23/2024) for CPE, AWV.     Shirlee Latch, MD  [facility] [phone]  (phone) [phone] (fax)  [facility]

## 2023-05-24 NOTE — Patient Instructions (Signed)
 Call [facility] to schedule a mammogram [phone]

## 2023-05-25 ENCOUNTER — Ambulatory Visit: Payer: Medicare PPO | Admitting: Podiatry

## 2023-05-25 ENCOUNTER — Ambulatory Visit (INDEPENDENT_AMBULATORY_CARE_PROVIDER_SITE_OTHER): Payer: Medicare PPO

## 2023-05-25 ENCOUNTER — Encounter: Payer: Self-pay | Admitting: Family Medicine

## 2023-05-25 ENCOUNTER — Encounter: Payer: Self-pay | Admitting: Podiatry

## 2023-05-25 DIAGNOSIS — M62462 Contracture of muscle, left lower leg: Secondary | ICD-10-CM | POA: Diagnosis not present

## 2023-05-25 DIAGNOSIS — M62461 Contracture of muscle, right lower leg: Secondary | ICD-10-CM | POA: Diagnosis not present

## 2023-05-25 DIAGNOSIS — M7662 Achilles tendinitis, left leg: Secondary | ICD-10-CM

## 2023-05-25 DIAGNOSIS — M722 Plantar fascial fibromatosis: Secondary | ICD-10-CM

## 2023-05-25 DIAGNOSIS — M7661 Achilles tendinitis, right leg: Secondary | ICD-10-CM

## 2023-05-25 LAB — COMPREHENSIVE METABOLIC PANEL
ALT: 19 [IU]/L (ref 0–32)
AST: 23 [IU]/L (ref 0–40)
Albumin: 4.5 g/dL (ref 3.8–4.8)
Alkaline Phosphatase: 76 [IU]/L (ref 44–121)
BUN/Creatinine Ratio: 18 (ref 12–28)
BUN: 14 mg/dL (ref 8–27)
Bilirubin Total: 0.7 mg/dL (ref 0.0–1.2)
CO2: 22 mmol/L (ref 20–29)
Calcium: 9.7 mg/dL (ref 8.7–10.3)
Chloride: 107 mmol/L — ABNORMAL HIGH (ref 96–106)
Creatinine, Ser: 0.78 mg/dL (ref 0.57–1.00)
Globulin, Total: 2 g/dL (ref 1.5–4.5)
Glucose: 95 mg/dL (ref 70–99)
Potassium: 4.5 mmol/L (ref 3.5–5.2)
Sodium: 145 mmol/L — ABNORMAL HIGH (ref 134–144)
Total Protein: 6.5 g/dL (ref 6.0–8.5)
eGFR: 79 mL/min/{1.73_m2} (ref >59)

## 2023-05-25 LAB — LIPID PANEL
Chol/HDL Ratio: 2.6 {ratio} (ref 0.0–4.4)
Cholesterol, Total: 202 mg/dL — ABNORMAL HIGH (ref 100–199)
HDL: 77 mg/dL (ref >39)
LDL Chol Calc (NIH): 108 mg/dL — ABNORMAL HIGH (ref 0–99)
Triglycerides: 95 mg/dL (ref 0–149)
VLDL Cholesterol Cal: 17 mg/dL (ref 5–40)

## 2023-05-25 LAB — HEMOGLOBIN A1C
Est. average glucose Bld gHb Est-mCnc: 111 mg/dL
Hgb A1c MFr Bld: 5.5 % (ref 4.8–5.6)

## 2023-05-25 MED ORDER — MELOXICAM 15 MG PO TABS: 15.0000 mg | ORAL_TABLET | Freq: Every day | ORAL | 3 refills | Status: DC

## 2023-05-25 NOTE — Progress Notes (Signed)
 Subjective:  Patient ID: Melissa Barry, female    DOB: 1945-11-04,  MRN: [id number]  Chief Complaint  Patient presents with   Foot Pain    Patient it hurts in her left heel and she has sharp pains that will shoot up her leg, she has horrible pain at night when she walks to the bathroom at night. The left heel is worst then the right. It is a burning feeling on bilateral feet.  The heel pain in patient left heel has been going on since November 2024. Patient left foot is swollen.    Discussed the use of AI scribe software for clinical note transcription with the patient, who gave verbal consent to proceed.  History of Present Illness   [name] is a 78 year old female who presents with heel pain.  She experiences heel pain primarily at the bottom of her heels, which began around November. The pain is most severe when she puts weight on her feet, such as when getting out of a car or first thing in the morning. The pain makes it difficult to walk to the bathroom at night, requiring her to hold onto furniture for support. She has tried using inserts, which provided minimal relief, but the pain persists.  The heel pain affects both feet, with the left heel being more problematic. The pain is exacerbated by pressure from wearing enclosed shoes like sneakers. She also mentions that her toes feel swollen when bent. No low back pain or sciatica is present.  She has a history of atrial fibrillation and is currently taking metoprolol and atorvastatin. She prefers Advil for pain relief.          Objective:    Physical Exam   EXTREMITIES: Mild edema with varicose veins and spider veins noted. Palpable DP and PT MUSCULOSKELETAL: Tenderness upon palpation of the plantar medial heel on the left foot, less so on the right foot. Presence of gastrocnemius equinus noted.       No images are attached to the encounter.    Results   Procedure: Corticosteroid injection Description:  Corticosteroid injection administered to the left heel. Cold spray applied to numb the skin. Injection site was the plantar medial heel. Informed Consent: Counseled on the risks, benefits, and alternatives to the corticosteroid injection. Discussed potential discomfort, short-term pain relief, and faster relief compared to oral medication.  RADIOLOGY Foot Radiographs: No acute stress fracture or fracture of the calcaneus. Posterior and plantar calcaneal spur. (05/25/2023)      Assessment:   1. Plantar fasciitis   2. Gastrocnemius equinus of right lower extremity   3. Gastrocnemius equinus of left lower extremity      Plan:  Patient was evaluated and treated and all questions answered.  Assessment and Plan    Plantar Fasciitis and Equinus Bilateral heel pain, worse on the left, with sharp pain on palpation of the plantar medial heel. Pain is exacerbated by weight-bearing activities and is particularly severe upon first standing after periods of rest. Radiographs show posterior implanter calcaneal spur. -Administered cortisone injection to the left heel for immediate relief. -Start Meloxicam  for systemic anti-inflammatory effect. Patient to take consistently for one month, then as needed. -Initiate home physical therapy plan to strengthen foot muscles and stretch calf muscles. -Follow up in eight weeks to re-evaluate.  Edema and Varicosities Mild edema with varicosities and spider veins noted. Patient reports discomfort from swelling and pressure when wearing enclosed shoes. -Awaiting results from vein specialist to determine cause  and appropriate treatment.       Advised to monitor her blood pressure while taking meloxicam  if she has significant elevations uncontrolled by her metoprolol she may discontinue   Return in about 8 weeks (around 07/20/2023) for recheck plantar fasciitis.

## 2023-05-25 NOTE — Patient Instructions (Addendum)
 VISIT SUMMARY:  Today, you were seen for heel pain that has been troubling you since November. The pain is most severe when you put weight on your feet, especially in the morning or after resting. You also mentioned some swelling in your toes and discomfort from wearing enclosed shoes. We discussed your current medications and your preference for Advil for pain relief.  YOUR PLAN:  -PLANTAR FASCIITIS AND EQUINUS: Plantar fasciitis is an inflammation of the tissue along the bottom of your foot, causing heel pain, especially when you first stand up. Equinus is a condition where the upward bending motion of the ankle joint is limited. Today, you received a cortisone injection in your left heel for immediate relief. You will start taking Meloxicam  daily for one month to reduce inflammation, and then as needed. Additionally, you should begin a home physical therapy plan to strengthen your foot muscles and stretch your calf muscles. We will follow up in eight weeks to see how you are doing.  -EDEMA AND VARICOSITIES: Edema is swelling caused by excess fluid trapped in your body's tissues, and varicosities are enlarged veins. You have mild swelling and spider veins, which cause discomfort when wearing enclosed shoes. We are currently waiting for results from a vein specialist to determine the cause and appropriate treatment.  INSTRUCTIONS:  Please follow up in eight weeks to re-evaluate your heel pain and the effectiveness of the treatment plan. Continue taking Meloxicam  as prescribed and follow the home physical therapy plan. We will also review the results from the vein specialist once they are available.   Plantar Fasciitis (Heel Spur Syndrome) with Rehab The plantar fascia is a fibrous, ligament-like, soft-tissue structure that spans the bottom of the foot. Plantar fasciitis is a condition that causes pain in the foot due to inflammation of the tissue. SYMPTOMS  Pain and tenderness on the underneath  side of the foot. Pain that worsens with standing or walking. CAUSES  Plantar fasciitis is caused by irritation and injury to the plantar fascia on the underneath side of the foot. Common mechanisms of injury include: Direct trauma to bottom of the foot. Damage to a small nerve that runs under the foot where the main fascia attaches to the heel bone. Stress placed on the plantar fascia due to bone spurs. RISK INCREASES WITH:  Activities that place stress on the plantar fascia (running, jumping, pivoting, or cutting). Poor strength and flexibility. Improperly fitted shoes. Tight calf muscles. Flat feet. Failure to warm-up properly before activity. Obesity. PREVENTION Warm up and stretch properly before activity. Allow for adequate recovery between workouts. Maintain physical fitness: Strength, flexibility, and endurance. Cardiovascular fitness. Maintain a health body weight. Avoid stress on the plantar fascia. Wear properly fitted shoes, including arch supports for individuals who have flat feet.  PROGNOSIS  If treated properly, then the symptoms of plantar fasciitis usually resolve without surgery. However, occasionally surgery is necessary.  RELATED COMPLICATIONS  Recurrent symptoms that may result in a chronic condition. Problems of the lower back that are caused by compensating for the injury, such as limping. Pain or weakness of the foot during push-off following surgery. Chronic inflammation, scarring, and partial or complete fascia tear, occurring more often from repeated injections.  TREATMENT  Treatment initially involves the use of ice and medication to help reduce pain and inflammation. The use of strengthening and stretching exercises may help reduce pain with activity, especially stretches of the Achilles tendon. These exercises may be performed at home or with a therapist. Your  caregiver may recommend that you use heel cups of arch supports to help reduce stress on the  plantar fascia. Occasionally, corticosteroid injections are given to reduce inflammation. If symptoms persist for greater than 6 months despite non-surgical (conservative), then surgery may be recommended.   MEDICATION  If pain medication is necessary, then nonsteroidal anti-inflammatory medications, such as aspirin and ibuprofen, or other minor pain relievers, such as acetaminophen , are often recommended. Do not take pain medication within 7 days before surgery. Prescription pain relievers may be given if deemed necessary by your caregiver. Use only as directed and only as much as you need. Corticosteroid injections may be given by your caregiver. These injections should be reserved for the most serious cases, because they may only be given a certain number of times.  HEAT AND COLD Cold treatment (icing) relieves pain and reduces inflammation. Cold treatment should be applied for 10 to 15 minutes every 2 to 3 hours for inflammation and pain and immediately after any activity that aggravates your symptoms. Use ice packs or massage the area with a piece of ice (ice massage). Heat treatment may be used prior to performing the stretching and strengthening activities prescribed by your caregiver, physical therapist, or athletic trainer. Use a heat pack or soak the injury in warm water.  SEEK IMMEDIATE MEDICAL CARE IF: Treatment seems to offer no benefit, or the condition worsens. Any medications produce adverse side effects.  EXERCISES- RANGE OF MOTION (ROM) AND STRETCHING EXERCISES - Plantar Fasciitis (Heel Spur Syndrome) These exercises may help you when beginning to rehabilitate your injury. Your symptoms may resolve with or without further involvement from your physician, physical therapist or athletic trainer. While completing these exercises, remember:  Restoring tissue flexibility helps normal motion to return to the joints. This allows healthier, less painful movement and activity. An  effective stretch should be held for at least 30 seconds. A stretch should never be painful. You should only feel a gentle lengthening or release in the stretched tissue.  RANGE OF MOTION - Toe Extension, Flexion Sit with your right / left leg crossed over your opposite knee. Grasp your toes and gently pull them back toward the top of your foot. You should feel a stretch on the bottom of your toes and/or foot. Hold this stretch for 10 seconds. Now, gently pull your toes toward the bottom of your foot. You should feel a stretch on the top of your toes and or foot. Hold this stretch for 10 seconds. Repeat  times. Complete this stretch 3 times per day.   RANGE OF MOTION - Ankle Dorsiflexion, Active Assisted Remove shoes and sit on a chair that is preferably not on a carpeted surface. Place right / left foot under knee. Extend your opposite leg for support. Keeping your heel down, slide your right / left foot back toward the chair until you feel a stretch at your ankle or calf. If you do not feel a stretch, slide your bottom forward to the edge of the chair, while still keeping your heel down. Hold this stretch for 10 seconds. Repeat 3 times. Complete this stretch 2 times per day.   STRETCH  Gastroc, Standing Place hands on wall. Extend right / left leg, keeping the front knee somewhat bent. Slightly point your toes inward on your back foot. Keeping your right / left heel on the floor and your knee straight, shift your weight toward the wall, not allowing your back to arch. You should feel a gentle stretch in  the right / left calf. Hold this position for 10 seconds. Repeat 3 times. Complete this stretch 2 times per day.  STRETCH  Soleus, Standing Place hands on wall. Extend right / left leg, keeping the other knee somewhat bent. Slightly point your toes inward on your back foot. Keep your right / left heel on the floor, bend your back knee, and slightly shift your weight over the back leg  so that you feel a gentle stretch deep in your back calf. Hold this position for 10 seconds. Repeat 3 times. Complete this stretch 2 times per day.  STRETCH  Gastrocsoleus, Standing  Note: This exercise can place a lot of stress on your foot and ankle. Please complete this exercise only if specifically instructed by your caregiver.  Place the ball of your right / left foot on a step, keeping your other foot firmly on the same step. Hold on to the wall or a rail for balance. Slowly lift your other foot, allowing your body weight to press your heel down over the edge of the step. You should feel a stretch in your right / left calf. Hold this position for 10 seconds. Repeat this exercise with a slight bend in your right / left knee. Repeat 3 times. Complete this stretch 2 times per day.   STRENGTHENING EXERCISES - Plantar Fasciitis (Heel Spur Syndrome)  These exercises may help you when beginning to rehabilitate your injury. They may resolve your symptoms with or without further involvement from your physician, physical therapist or athletic trainer. While completing these exercises, remember:  Muscles can gain both the endurance and the strength needed for everyday activities through controlled exercises. Complete these exercises as instructed by your physician, physical therapist or athletic trainer. Progress the resistance and repetitions only as guided.  STRENGTH - Towel Curls Sit in a chair positioned on a non-carpeted surface. Place your foot on a towel, keeping your heel on the floor. Pull the towel toward your heel by only curling your toes. Keep your heel on the floor. Repeat 3 times. Complete this exercise 2 times per day.  STRENGTH - Ankle Inversion Secure one end of a rubber exercise band/tubing to a fixed object (table, pole). Loop the other end around your foot just before your toes. Place your fists between your knees. This will focus your strengthening at your ankle. Slowly,  pull your big toe up and in, making sure the band/tubing is positioned to resist the entire motion. Hold this position for 10 seconds. Have your muscles resist the band/tubing as it slowly pulls your foot back to the starting position. Repeat 3 times. Complete this exercises 2 times per day.  Document Released: 04/06/2005 Document Revised: 06/29/2011 Document Reviewed: 07/19/2008 [facility] Patient Information 2014 [place], [place].

## 2023-05-26 DIAGNOSIS — I119 Hypertensive heart disease without heart failure: Secondary | ICD-10-CM | POA: Diagnosis not present

## 2023-05-26 DIAGNOSIS — Z6833 Body mass index (BMI) 33.0-33.9, adult: Secondary | ICD-10-CM | POA: Diagnosis not present

## 2023-05-26 DIAGNOSIS — E66811 Obesity, class 1: Secondary | ICD-10-CM | POA: Diagnosis not present

## 2023-05-26 DIAGNOSIS — E7849 Other hyperlipidemia: Secondary | ICD-10-CM | POA: Diagnosis not present

## 2023-05-26 DIAGNOSIS — I38 Endocarditis, valve unspecified: Secondary | ICD-10-CM | POA: Diagnosis not present

## 2023-05-26 DIAGNOSIS — I479 Paroxysmal tachycardia, unspecified: Secondary | ICD-10-CM | POA: Diagnosis not present

## 2023-05-26 DIAGNOSIS — R600 Localized edema: Secondary | ICD-10-CM | POA: Diagnosis not present

## 2023-05-26 DIAGNOSIS — I10 Essential (primary) hypertension: Secondary | ICD-10-CM | POA: Diagnosis not present

## 2023-05-26 DIAGNOSIS — I480 Paroxysmal atrial fibrillation: Secondary | ICD-10-CM | POA: Diagnosis not present

## 2023-06-07 ENCOUNTER — Other Ambulatory Visit (INDEPENDENT_AMBULATORY_CARE_PROVIDER_SITE_OTHER): Payer: Self-pay | Admitting: Nurse Practitioner

## 2023-06-07 DIAGNOSIS — M79604 Pain in right leg: Secondary | ICD-10-CM

## 2023-06-11 ENCOUNTER — Ambulatory Visit (INDEPENDENT_AMBULATORY_CARE_PROVIDER_SITE_OTHER): Payer: Medicare Other

## 2023-06-11 ENCOUNTER — Ambulatory Visit (INDEPENDENT_AMBULATORY_CARE_PROVIDER_SITE_OTHER): Payer: Medicare PPO

## 2023-06-11 ENCOUNTER — Ambulatory Visit (INDEPENDENT_AMBULATORY_CARE_PROVIDER_SITE_OTHER): Payer: Medicare PPO | Admitting: Nurse Practitioner

## 2023-06-11 ENCOUNTER — Encounter (INDEPENDENT_AMBULATORY_CARE_PROVIDER_SITE_OTHER): Payer: Self-pay | Admitting: Nurse Practitioner

## 2023-06-11 VITALS — BP 169/83 | HR 66 | Resp 18 | Ht 65.0 in | Wt 206.2 lb

## 2023-06-11 DIAGNOSIS — M79604 Pain in right leg: Secondary | ICD-10-CM

## 2023-06-11 DIAGNOSIS — M79605 Pain in left leg: Secondary | ICD-10-CM

## 2023-06-11 DIAGNOSIS — I872 Venous insufficiency (chronic) (peripheral): Secondary | ICD-10-CM

## 2023-06-11 DIAGNOSIS — M150 Primary generalized (osteo)arthritis: Secondary | ICD-10-CM | POA: Diagnosis not present

## 2023-06-11 NOTE — Progress Notes (Incomplete)
Subjective:    Patient ID: Melissa Barry, female    DOB: 07/11/1945, 78 y.o.   MRN: [id number] Chief Complaint  Patient presents with  . Follow-up    ABI & Bilateral venous reflux    Melissa Barry is a 78 year old female who presents today for evaluation of bilateral lower extremity edema as well as pain and discomfort.  The patient has notable swelling in her lower extremities with the left foot being worse than the right.  She endorses having pain in her lower extremities with activity.  Based on her description of symptoms sound somewhat concerning for possible claudication but further evaluation also increases suspicion for possible musculoskeletal issue currently she has no open wounds or ulcerations.  She has some spider varicosities bilaterally.  She is elevating her legs which is slightly helpful she has also worn compression socks which she finds to be extremely uncomfortable.  Her largest issue is of pain in her left foot.  She notes that when she stands on the left foot is extremely painful to the point to where she can barely put any pressure on it.  When she actually starts walking he eases up but it still continues to have pain and discomfort.  This is located in the heel area and radiates up towards the arch area.    Review of Systems  Cardiovascular:  Positive for leg swelling.  Musculoskeletal:  Positive for arthralgias.  All other systems reviewed and are negative.      Objective:   Physical Exam Vitals reviewed.  HENT:     Head: Normocephalic.  Cardiovascular:     Rate and Rhythm: Normal rate.     Pulses:          Dorsalis pedis pulses are 1+ on the right side and 2+ on the left side.  Pulmonary:     Effort: Pulmonary effort is normal.  Musculoskeletal:     Left lower leg: 1+ Edema present.  Skin:    General: Skin is warm and dry.  Neurological:     Mental Status: She is alert and oriented to person, place, and time.  Psychiatric:        Mood and Affect:  Mood normal.        Behavior: Behavior normal.        Thought Content: Thought content normal.        Judgment: Judgment normal.     BP (!) 169/83   Pulse 66   Resp 18   Ht 5\' 5"  (1.651 m)   Wt 206 lb 3.2 oz (93.5 kg)   BMI 34.31 kg/m   Past Medical History:  Diagnosis Date  . Arthritis   . Medical history non-contributory   . Primary hypertension 10/31/2020    Social History   Socioeconomic History  . Marital status: Married    Spouse name: Not on file  . Number of children: 2  . Years of education: Not on file  . Highest education level: Some college, no degree  Occupational History  . Occupation: retired  Tobacco Use  . Smoking status: Former    Types: Cigarettes  . Smokeless tobacco: Never  . Tobacco comments:    only smoked in college  Vaping Use  . Vaping status: Never Used  Substance and Sexual Activity  . Alcohol use: Yes    Alcohol/week: 0.0 - 4.0 standard drinks of alcohol    Comment: a couple drinks a month  . Drug use: No  . Sexual activity:  Yes  Other Topics Concern  . Not on file  Social History Narrative  . Not on file   Social Drivers of Health   Financial Resource Strain: Low Risk  (05/21/2023)   Overall Financial Resource Strain (CARDIA)   . Difficulty of Paying Living Expenses: Not hard at all  Food Insecurity: No Food Insecurity (05/21/2023)   Hunger Vital Sign   . Worried About Programme researcher, broadcasting/film/video in the Last Year: Never true   . Ran Out of Food in the Last Year: Never true  Transportation Needs: No Transportation Needs (05/21/2023)   PRAPARE - Transportation   . Lack of Transportation (Medical): No   . Lack of Transportation (Non-Medical): No  Physical Activity: Unknown (05/21/2023)   Exercise Vital Sign   . Days of Exercise per Week: 0 days   . Minutes of Exercise per Session: Not on file  Stress: No Stress Concern Present (05/21/2023)   [employer] of Occupational Health - Occupational Stress Questionnaire   . Feeling of  Stress : Not at all  Social Connections: Moderately Isolated (05/21/2023)   Social Connection and Isolation Panel [NHANES]   . Frequency of Communication with Friends and Family: Once a week   . Frequency of Social Gatherings with Friends and Family: Once a week   . Attends Religious Services: 1 to 4 times per year   . Active Member of Clubs or Organizations: No   . Attends Banker Meetings: Not on file   . Marital Status: Married  Catering manager Violence: Not At Risk (03/07/2020)   Humiliation, Afraid, Rape, and Kick questionnaire   . Fear of Current or Ex-Partner: No   . Emotionally Abused: No   . Physically Abused: No   . Sexually Abused: No    Past Surgical History:  Procedure Laterality Date  . BREAST BIOPSY Left years ago   Dr. Lemar Livings   . CATARACT EXTRACTION W/PHACO Right 10/29/2020   Procedure: CATARACT EXTRACTION PHACO AND INTRAOCULAR LENS PLACEMENT (IOC) RIGHT VIVITY LENS 8.96 01:00.6;  Surgeon: Galen Manila, MD;  Location: [facility];  Service: Ophthalmology;  Laterality: Right;  DEXTENZA  . CATARACT EXTRACTION W/PHACO Left 11/12/2020   Procedure: CATARACT EXTRACTION PHACO AND INTRAOCULAR LENS PLACEMENT (IOC) LEFT VIVITY LENS 4.73 00:31.0;  Surgeon: Galen Manila, MD;  Location: [facility];  Service: Ophthalmology;  Laterality: Left;  DEXTENZA  . COLONOSCOPY WITH PROPOFOL N/A 12/17/2015   Procedure: COLONOSCOPY WITH PROPOFOL;  Surgeon: Midge Minium, MD;  Location: [facility];  Service: Endoscopy;  Laterality: N/A;  . DILATION AND CURETTAGE OF UTERUS    . EYE SURGERY Right 10/29/20 & 11/12/20   cataract  . TONSILLECTOMY AND ADENOIDECTOMY      Family History  Problem Relation Age of Onset  . Ovarian cancer Mother   . Cancer Mother   . Heart disease Father   . Lung cancer Father   . Thyroid disease Father   . Congestive Heart Failure Father   . Cancer Father   . Hepatitis Sister   . Celiac disease Sister   . Migraines  Daughter   . Migraines Son   . Sarcoidosis Son   . Parkinson's disease Maternal Grandfather   . Stroke Paternal Grandmother   . Lung cancer Paternal Grandfather     Allergies  Allergen Reactions  . Doxycycline Other (See Comments)    Made her feel sick the whole time she was taking it.   Marland Kitchen Penicillins Rash  . Sulfa Antibiotics Swelling and  Rash    Patient states throat swelling        Latest Ref Rng & Units 05/08/2021   12:02 AM 09/20/2017    9:09 PM 02/24/2017    8:53 AM  CBC  WBC 4.0 - 10.5 K/uL 6.5  6.0  5.2   Hemoglobin 12.0 - 15.0 g/dL 82.9  56.2  13.0   Hematocrit 36.0 - 46.0 % 43.5  43.6  43.6   Platelets 150 - 400 K/uL 218  204  229       CMP     Component Value Date/Time   NA 145 (H) 05/24/2023 0943   K 4.5 05/24/2023 0943   CL 107 (H) 05/24/2023 0943   CO2 22 05/24/2023 0943   GLUCOSE 95 05/24/2023 0943   GLUCOSE 133 (H) 05/08/2021 0002   BUN 14 05/24/2023 0943   CREATININE 0.78 05/24/2023 0943   CREATININE 0.76 02/24/2017 0853   CALCIUM 9.7 05/24/2023 0943   PROT 6.5 05/24/2023 0943   ALBUMIN 4.5 05/24/2023 0943   AST 23 05/24/2023 0943   ALT 19 05/24/2023 0943   ALKPHOS 76 05/24/2023 0943   BILITOT 0.7 05/24/2023 0943   EGFR 79 05/24/2023 0943   GFRNONAA >60 05/08/2021 0002   GFRNONAA 80 02/24/2017 0853     No results found.     Assessment & Plan:   1. Achilles tendinitis of both lower extremities (Primary) I suspect that the patient's foot pain is plantar fasciitis.  We will send the patient to podiatry for further workup and evaluation.  I do believe that a large portion of the pain and discomfort she has with ambulation is related to this pain. - Ambulatory referral to Podiatry  2. Leg pain, bilateral  Recommend:  The patient has atypical pain symptoms for pure atherosclerotic disease. However, on physical exam there is evidence of vascular disease, given the diminished pulses and the edema of the legs.  Further investigation of the  patient's vascular disease is necessary to determine the relationship of the patient's lower extremity symptoms and the degree of vascular disease.  Noninvasive studies of the will be obtained and the patient will follow up with me to review these studies.  Use of medical grade compression is recommended as well.   The patient should continue walking and begin a more formal exercise program. The patient should continue his antiplatelet therapy and aggressive treatment of the lipid abnormalities.    Current Outpatient Medications on File Prior to Visit  Medication Sig Dispense Refill  . atorvastatin (LIPITOR) 40 MG tablet Take 20 mg by mouth daily.    . meloxicam (MOBIC) 15 MG tablet Take 1 tablet (15 mg total) by mouth daily. 30 tablet 3  . metoprolol succinate (TOPROL-XL) 25 MG 24 hr tablet Take 1 tablet by mouth daily.    . Multiple Vitamins-Minerals (PRESERVISION AREDS PO) Take by mouth.     No current facility-administered medications on file prior to visit.    There are no Patient Instructions on file for this visit. No follow-ups on file.   Georgiana Spinner, NP

## 2023-06-11 NOTE — Progress Notes (Signed)
 Subjective:    Patient ID: Melissa Barry, female    DOB: 11/06/1945, 78 y.o.   MRN: [id number] Chief Complaint  Patient presents with   Follow-up    ABI & Bilateral venous reflux    Melissa Barry is a 78 year old female who returns today for evaluation of bilateral lower extremity edema as well as pain and discomfort.  The patient has notable swelling in her lower extremities with the left foot being worse than the right.  She endorses having pain in her lower extremities with activity.  Based on her description of symptoms sound somewhat concerning for possible claudication but further evaluation also increases suspicion for possible musculoskeletal issue currently she has no open wounds or ulcerations.  She has some spider varicosities bilaterally.  She is elevating her legs which is slightly helpful she has also worn compression socks which she finds to be extremely uncomfortable.  Her largest issue is of pain in her left foot.  She notes that when she stands on the left foot is extremely painful to the point to where she can barely put any pressure on it.  When she actually starts walking he eases up but it still continues to have pain and discomfort.  This is located in the heel area and radiates up towards the arch area.  Since her last visit she has visited podiatry and received a steroid shot in her left foot.  She notes that the pain previously described has resolved.  She also was prescribed meloxicam and the claudication-like symptoms that she was experiencing has also resolved.  She still does continue to have some swelling noted.  Today the patient underwent ABIs which show an ABI of 1.07 on the right and 0.96 on the left.  She has a TBI of 0.6 on the right and 0.76 on the left.  She has strong multiphasic tibial vessel waveforms bilaterally with good toe waveforms bilaterally.  She underwent a bilateral venous reflux study as well which shows no evidence of DVT bilaterally.  No  evidence of superficial thrombophlebitis bilaterally.  The right lower extremity has no evidence of deep venous insufficiency or superficial venous reflux.  The left does have a small amount of deep venous insufficiency as well as superficial venous reflux in the great saphenous vein.    Review of Systems  Cardiovascular:  Positive for leg swelling.  All other systems reviewed and are negative.      Objective:   Physical Exam Vitals reviewed.  HENT:     Head: Normocephalic.  Cardiovascular:     Rate and Rhythm: Normal rate.     Pulses:          Dorsalis pedis pulses are 1+ on the right side and 2+ on the left side.  Pulmonary:     Effort: Pulmonary effort is normal.  Musculoskeletal:     Left lower leg: 1+ Edema present.  Skin:    General: Skin is warm and dry.  Neurological:     Mental Status: She is alert and oriented to person, place, and time.  Psychiatric:        Mood and Affect: Mood normal.        Behavior: Behavior normal.        Thought Content: Thought content normal.        Judgment: Judgment normal.     BP (!) 169/83   Pulse 66   Resp 18   Ht 5\' 5"  (1.651 m)   Wt 206 lb  3.2 oz (93.5 kg)   BMI 34.31 kg/m   Past Medical History:  Diagnosis Date   Arthritis    Medical history non-contributory    Primary hypertension 10/31/2020    Social History   Socioeconomic History   Marital status: Married    Spouse name: Not on file   Number of children: 2   Years of education: Not on file   Highest education level: Some college, no degree  Occupational History   Occupation: retired  Tobacco Use   Smoking status: Former    Types: Cigarettes   Smokeless tobacco: Never   Tobacco comments:    only smoked in [employer], [employer]   Vaping status: Never Used  Substance and Sexual Activity   Alcohol use: Yes    Alcohol/week: 0.0 - 4.0 standard drinks of alcohol    Comment: a couple drinks a month   Drug use: No   Sexual activity: Yes  Other Topics  Concern   Not on file  Social History Narrative   Not on file   Social Drivers of Health   Financial Resource Strain: Low Risk  (05/21/2023)   Overall Financial Resource Strain (CARDIA)    Difficulty of Paying Living Expenses: Not hard at all  Food Insecurity: No Food Insecurity (05/21/2023)   Hunger Vital Sign    Worried About Running Out of Food in the Last Year: Never true    Ran Out of Food in the Last Year: Never true  Transportation Needs: No Transportation Needs (05/21/2023)   PRAPARE - Administrator, Civil Service (Medical): No    Lack of Transportation (Non-Medical): No  Physical Activity: Unknown (05/21/2023)   Exercise Vital Sign    Days of Exercise per Week: 0 days    Minutes of Exercise per Session: Not on file  Stress: No Stress Concern Present (05/21/2023)   [employer] of Occupational Health - Occupational Stress Questionnaire    Feeling of Stress : Not at all  Social Connections: Moderately Isolated (05/21/2023)   Social Connection and Isolation Panel [NHANES]    Frequency of Communication with Friends and Family: Once a week    Frequency of Social Gatherings with Friends and Family: Once a week    Attends Religious Services: 1 to 4 times per year    Active Member of [employer] or Organizations: No    Attends Banker Meetings: Not on file    Marital Status: Married  Catering manager Violence: Not At Risk (03/07/2020)   Humiliation, Afraid, Rape, and Kick questionnaire    Fear of Current or Ex-Partner: No    Emotionally Abused: No    Physically Abused: No    Sexually Abused: No    Past Surgical History:  Procedure Laterality Date   BREAST BIOPSY Left years ago   Dr. Lemar Livings    CATARACT EXTRACTION W/PHACO Right 10/29/2020   Procedure: CATARACT EXTRACTION PHACO AND INTRAOCULAR LENS PLACEMENT (IOC) RIGHT VIVITY LENS 8.96 01:00.6;  Surgeon: Galen Manila, MD;  Location: [facility];  Service: Ophthalmology;  Laterality: Right;   DEXTENZA   CATARACT EXTRACTION W/PHACO Left 11/12/2020   Procedure: CATARACT EXTRACTION PHACO AND INTRAOCULAR LENS PLACEMENT (IOC) LEFT VIVITY LENS 4.73 00:31.0;  Surgeon: Galen Manila, MD;  Location: [facility];  Service: Ophthalmology;  Laterality: Left;  DEXTENZA   COLONOSCOPY WITH PROPOFOL N/A 12/17/2015   Procedure: COLONOSCOPY WITH PROPOFOL;  Surgeon: Midge Minium, MD;  Location: [facility];  Service: Endoscopy;  Laterality: N/A;  DILATION AND CURETTAGE OF UTERUS     EYE SURGERY Right 10/29/20 & 11/12/20   cataract   TONSILLECTOMY AND ADENOIDECTOMY      Family History  Problem Relation Age of Onset   Ovarian cancer Mother    Cancer Mother    Heart disease Father    Lung cancer Father    Thyroid disease Father    Congestive Heart Failure Father    Cancer Father    Hepatitis Sister    Celiac disease Sister    Migraines Daughter    Migraines Son    Sarcoidosis Son    Parkinson's disease Maternal Grandfather    Stroke Paternal Grandmother    Lung cancer Paternal Grandfather     Allergies  Allergen Reactions   Doxycycline Other (See Comments)    Made her feel sick the whole time she was taking it.    Penicillins Rash   Sulfa Antibiotics Swelling and Rash    Patient states throat swelling        Latest Ref Rng & Units 05/08/2021   12:02 AM 09/20/2017    9:09 PM 02/24/2017    8:53 AM  CBC  WBC 4.0 - 10.5 K/uL 6.5  6.0  5.2   Hemoglobin 12.0 - 15.0 g/dL 29.5  62.1  30.8   Hematocrit 36.0 - 46.0 % 43.5  43.6  43.6   Platelets 150 - 400 K/uL 218  204  229       CMP     Component Value Date/Time   NA 145 (H) 05/24/2023 0943   K 4.5 05/24/2023 0943   CL 107 (H) 05/24/2023 0943   CO2 22 05/24/2023 0943   GLUCOSE 95 05/24/2023 0943   GLUCOSE 133 (H) 05/08/2021 0002   BUN 14 05/24/2023 0943   CREATININE 0.78 05/24/2023 0943   CREATININE 0.76 02/24/2017 0853   CALCIUM 9.7 05/24/2023 0943   PROT 6.5 05/24/2023 0943   ALBUMIN 4.5 05/24/2023 0943    AST 23 05/24/2023 0943   ALT 19 05/24/2023 0943   ALKPHOS 76 05/24/2023 0943   BILITOT 0.7 05/24/2023 0943   EGFR 79 05/24/2023 0943   GFRNONAA >60 05/08/2021 0002   GFRNONAA 80 02/24/2017 0853     No results found.     Assessment & Plan:   1. Bilateral leg pain (Primary) I suspect the claudication-like symptoms that she had previously were a combination of arthritis as well as her plantar fasciitis.  Much of that has resolved with the steroid shot and the introduction of meloxicam.  Currently no interventions required from a vascular standpoint.  2. Primary osteoarthritis involving multiple joints We will continue to follow with podiatry and PCP  3. Chronic venous insufficiency The patient does have venous insufficiency noted in her left lower extremity.  This likely accounts for the worse swelling in her left leg.  I had a long discussion with the patient regarding the nature and pathophysiology of venous disease as well as the associated symptoms.  We discussed treatment options including conservative therapy with the use of medical grade compression socks, elevation and activity.  Additionally, we discussed more invasive options with the use of endovenous laser ablation.  Following discussion of these options the patient elects to move forward with a more conservative basis.  I also discussed with the patient that if she begins to have worsening swelling or issues with her varicosities that she is welcome to follow-up regarding these.  We discussed things such as bleeding or ulceration which  would prompt urgent attention.  She is encouraged to follow conservative therapies with use of medical grade compression.  Patient will follow-up as needed.     Current Outpatient Medications on File Prior to Visit  Medication Sig Dispense Refill   atorvastatin (LIPITOR) 40 MG tablet Take 20 mg by mouth daily.     meloxicam (MOBIC) 15 MG tablet Take 1 tablet (15 mg total) by mouth daily. 30  tablet 3   metoprolol succinate (TOPROL-XL) 25 MG 24 hr tablet Take 1 tablet by mouth daily.     Multiple Vitamins-Minerals (PRESERVISION AREDS PO) Take by mouth.     No current facility-administered medications on file prior to visit.    There are no Patient Instructions on file for this visit. No follow-ups on file.   Georgiana Spinner, NP

## 2023-06-14 LAB — VAS US ABI WITH/WO TBI
Left ABI: 0.96
Right ABI: 1.07

## 2023-07-21 ENCOUNTER — Ambulatory Visit: Payer: Medicare PPO | Admitting: Podiatry

## 2023-07-21 ENCOUNTER — Encounter: Payer: Self-pay | Admitting: Podiatry

## 2023-07-21 DIAGNOSIS — M722 Plantar fascial fibromatosis: Secondary | ICD-10-CM

## 2023-07-21 DIAGNOSIS — M62462 Contracture of muscle, left lower leg: Secondary | ICD-10-CM | POA: Diagnosis not present

## 2023-07-21 DIAGNOSIS — M62461 Contracture of muscle, right lower leg: Secondary | ICD-10-CM | POA: Diagnosis not present

## 2023-07-21 MED ORDER — PREDNISONE 10 MG PO TABS: ORAL_TABLET | ORAL | 0 refills | Status: AC

## 2023-07-21 NOTE — Patient Instructions (Signed)
 [facility]  [address] [place], [place] [address] Phone: [phone]

## 2023-07-21 NOTE — Progress Notes (Signed)
  Subjective:  Patient ID: Melissa Barry, female    DOB: 1945-08-10,  MRN: [id number]  Chief Complaint  Patient presents with   Plantar Fasciitis    "It's just like it was when I came to see him."    78 y.o. female presents with the above complaint. History confirmed with patient.  She returns for follow-up.  She did have testing events for surgery which was slightly decreased in the left but not severe enough to warrant any procedures.  The shot made the heel feel well for a couple weeks but is back to where it was started.  Objective:  Physical Exam:  warm, good capillary refill, no trophic changes or ulcerative lesions, normal DP and PT pulses, normal sensory exam, and pain ovation to plantar heel and plantar fascia.   Radiographs: Multiple views x-ray of the left foot: no fracture, dislocation, swelling or degenerative changes noted, plantar calcaneal spur, posterior calcaneal spur, and Haglund deformity noted Assessment:   1. Plantar fasciitis   2. Gastrocnemius equinus of right lower extremity   3. Gastrocnemius equinus of left lower extremity      Plan:  Patient was evaluated and treated and all questions answered.  Has not had much long-term relief with plantar fascial injection.  I recommended support and immobilization with a plantar fascial brace.  This will offload the soft tissues and facilitate healing and rest.  This was dispensed today.  I also recommended formal physical therapy and referral will be placed for [facility].  Prednisone taper sent to pharmacy as an anti-inflammatory to use again and then can continue meloxicam.  Return in 6 weeks if no improvement appointment recommended. Return in about 6 weeks (around 09/01/2023) for recheck plantar fasciitis.

## 2023-08-11 ENCOUNTER — Telehealth: Payer: Self-pay | Admitting: Podiatry

## 2023-08-11 NOTE — Telephone Encounter (Signed)
 Pt. Checking: 1 status of referral for PT-she has not recv'ed and call to start   2. Medication prescribe 4/2 (prednisone ) did not give any relief. Appt for 5/14 resch'ed for 4/28  a. She stated she was given and injection and prescribed Meloxicam  before and that gave her temporary relief for about a month

## 2023-08-16 ENCOUNTER — Ambulatory Visit: Admitting: Podiatry

## 2023-08-16 ENCOUNTER — Encounter: Payer: Self-pay | Admitting: Podiatry

## 2023-08-16 VITALS — Ht 65.0 in | Wt 206.0 lb

## 2023-08-16 DIAGNOSIS — M722 Plantar fascial fibromatosis: Secondary | ICD-10-CM | POA: Diagnosis not present

## 2023-08-16 NOTE — Patient Instructions (Signed)

## 2023-08-18 NOTE — Progress Notes (Signed)
  Subjective:  Patient ID: Melissa Barry, female    DOB: 1946/02/11,  MRN: [id number]  Chief Complaint  Patient presents with   Plantar Fasciitis    Patient is here for F/U for plantar fasciitis states brace made feet hurt worse after wearing one week and prednisone  did not help at all    78 y.o. female presents with the above complaint. History confirmed with patient.   Objective:  Physical Exam:  warm, good capillary refill, no trophic changes or ulcerative lesions, normal DP and PT pulses, normal sensory exam, and pain to palpation on the left to plantar heel and plantar fascia.   Radiographs: Multiple views x-ray of the left foot: no fracture, dislocation, swelling or degenerative changes noted, plantar calcaneal spur, posterior calcaneal spur, and Haglund deformity noted Assessment:   1. Plantar fasciitis      Plan:  Patient was evaluated and treated and all questions answered.  Continue meloxicam  and physical therapy.  Recommended corticosteroid injection.  Following consent and prepped with alcohol left plantar heel and fascia was injected with 20 mg Kenalog 4 mg dexamethasone  and 5 mg of Marcaine 0.5% plain.  She tolerated this well and it was dressed with a bandage.  If no improvement by next visit recommend MRI and consider surgical options  Return in about 6 weeks (around 09/27/2023) for recheck plantar fasciitis.

## 2023-09-01 ENCOUNTER — Ambulatory Visit: Admitting: Podiatry

## 2023-09-07 ENCOUNTER — Encounter (INDEPENDENT_AMBULATORY_CARE_PROVIDER_SITE_OTHER): Payer: Self-pay

## 2023-09-15 ENCOUNTER — Ambulatory Visit
Admission: RE | Admit: 2023-09-15 | Discharge: 2023-09-15 | Disposition: A | Discharge status: Home / Self Care | Source: Ambulatory Visit | Attending: Family Medicine | Admitting: Family Medicine

## 2023-09-15 DIAGNOSIS — Z1231 Encounter for screening mammogram for malignant neoplasm of breast: Secondary | ICD-10-CM | POA: Insufficient documentation

## 2023-09-23 ENCOUNTER — Ambulatory Visit: Payer: Self-pay | Admitting: Family Medicine

## 2023-09-23 ENCOUNTER — Telehealth: Payer: Self-pay | Admitting: Podiatry

## 2023-09-23 NOTE — Telephone Encounter (Signed)
 Patient called in regards to meloxicam . States she has taken one full 30 day prescription and has started on her second, but this morning had blood in her stool. Patient wants to know if she should discontinue use. Please advise, thanks.

## 2023-09-27 ENCOUNTER — Ambulatory Visit: Admitting: Podiatry

## 2023-09-27 ENCOUNTER — Encounter: Payer: Self-pay | Admitting: Podiatry

## 2023-09-27 DIAGNOSIS — M722 Plantar fascial fibromatosis: Secondary | ICD-10-CM | POA: Diagnosis not present

## 2023-09-28 ENCOUNTER — Encounter: Payer: Self-pay | Admitting: Podiatry

## 2023-09-28 NOTE — Progress Notes (Signed)
  Subjective:  Patient ID: Melissa Barry, female    DOB: July 16, 1945,  MRN: [id number]  Chief Complaint  Patient presents with   Plantar Fasciitis    "It's okay.  It's probably still swollen.  They said I didn't need any more physical therapy.  I saw blood in my stool on June 5th.  The doctor told me to stop taking the Meloxicam ."    78 y.o. female presents with the above complaint. History confirmed with patient.   Objective:  Physical Exam:  warm, good capillary refill, no trophic changes or ulcerative lesions, normal DP and PT pulses, normal sensory exam, and little to no pain today on palpation   Radiographs: Multiple views x-ray of the left foot: no fracture, dislocation, swelling or degenerative changes noted, plantar calcaneal spur, posterior calcaneal spur, and Haglund deformity noted Assessment:   1. Plantar fasciitis      Plan:  Patient was evaluated and treated and all questions answered.  Doing very well.  She is completing therapy.  We discussed continuing home stretching and strengthening exercises to prevent recurrence.  Follow-up with me as needed.  Return if symptoms worsen or fail to improve.

## 2023-10-04 ENCOUNTER — Ambulatory Visit: Payer: Self-pay

## 2023-10-04 NOTE — Telephone Encounter (Signed)
 FYI Only or Action Required?: FYI only for provider  Patient was last seen in primary care on 05/24/2023 by Mazie Speed, MD. Called Nurse Triage reporting Blood In Stools. Symptoms began several weeks ago. Interventions attempted: Other: stopped taking meloxicam . Symptoms are mild rectal bleeding occurs once daily with stool stable.  Triage Disposition: See PCP When Office is Open (Within 3 Days)  Patient/caregiver understands and will follow disposition?: Yes                   Copied from CRM [id number]. Topic: Clinical - Red Word Triage >> Oct 04, 2023 11:14 AM [place] wrote: Red Word that prompted transfer to Nurse Triage: bright red blood in stool started on 6/5, foot doctor told her to discontinue Meloxicam  and go to ER if she feels lightheaded, still happening Reason for Disposition  MILD rectal bleeding (more than just a few drops or streaks)  Answer Assessment - Initial Assessment Questions 1. APPEARANCE of BLOOD: What color is it? Is it passed separately, on the surface of the stool, or mixed in with the stool?      Bright red blood. A glob on top of it sort of.  2. AMOUNT: How much blood was passed?      About the size of a tablespoon or less.  3. FREQUENCY: How many times has blood been passed with the stools?      She states she had blood on 6/5 and 6/6, appeared again on 6/11 thru today. Once daily those days.  4. ONSET: When was the blood first seen in the stools? (Days or weeks)      09/23/23.  5. DIARRHEA: Is there also some diarrhea? If Yes, ask: How many diarrhea stools in the past 24 hours?      Loose, runny stool. She states it has been like that for a while.  6. CONSTIPATION: Do you have constipation? If Yes, ask: How bad is it?     No.  7. RECURRENT SYMPTOMS: Have you had blood in your stools before? If Yes, ask: When was the last time? and What happened that time?      No.  8. BLOOD THINNERS: Do you take any  blood thinners? (e.g., Coumadin/warfarin, Pradaxa/dabigatran, aspirin)     No blood thinners but patient recently had been placed on Meloxicam  (last dose on 09/23/23)  9. OTHER SYMPTOMS: Do you have any other symptoms?  (e.g., abdomen pain, vomiting, dizziness, fever)     She states during the day her knees and legs will hurt.  10. PREGNANCY: Is there any chance you are pregnant? When was your last menstrual period?       N/A.  Patient denies weakness/faintness, dizziness, nausea, vomiting, fever.  Protocols used: Rectal Bleeding-A-AH

## 2023-10-07 NOTE — Progress Notes (Signed)
 Established patient visit  Patient: Melissa Barry   DOB: 08-24-1945   78 y.o. Female  MRN: [id number] Visit Date: 10/08/2023  Today's healthcare provider: Jolynn Spencer, PA-C   No chief complaint on file.  Subjective        History of Present Illness   Discussed the use of AI scribe software for clinical note transcription with the patient, who gave verbal consent to proceed.  History of Present Illness   Melissa Barry is a 78 year old female with internal hemorrhoids who presents with gastrointestinal bleeding.  She noticed bright red blood in her stool on June 5th, which she associates with meloxicam  use. She discontinued meloxicam  on June 6th. The bleeding was initially sporadic, absent on June 10th, but resumed on June 11th and continues intermittently. The blood is bright red, appearing on toilet paper and in the toilet bowl. She experiences no pain with the bleeding and reports loose stools occurring once daily. She consumes green tea and prune juice to aid bowel movements but does not adhere to a high-fiber diet. Her past medical history includes colitis. Internal hemorrhoids were identified during a colonoscopy in 2017.          05/24/2023    8:52 AM 06/18/2022   10:21 AM 05/21/2022    9:06 AM  Depression screen PHQ 2/9  Decreased Interest 0 0 0  Down, Depressed, Hopeless 0 0 0  PHQ - 2 Score 0 0 0  Altered sleeping  0 0  Tired, decreased energy  0 0  Change in appetite  0 0  Feeling bad or failure about yourself   0 0  Trouble concentrating  0 0  Moving slowly or fidgety/restless  0 0  Suicidal thoughts  0 0  PHQ-9 Score  0 0  Difficult doing work/chores  Not difficult at all Not difficult at all      05/24/2023    8:52 AM  GAD 7 : Generalized Anxiety Score  Nervous, Anxious, on Edge 0  Control/stop worrying 0  Worry too much - different things 0  Trouble relaxing 0  Restless 0  Easily annoyed or irritable 0  Afraid - awful might happen 0  Total GAD 7 Score  0  Anxiety Difficulty Not difficult at all    Medications: Outpatient Medications Prior to Visit  Medication Sig   atorvastatin (LIPITOR) 40 MG tablet Take 40 mg by mouth daily.   meloxicam  (MOBIC ) 15 MG tablet Take 1 tablet (15 mg total) by mouth daily. (Patient not taking: Reported on 09/27/2023)   metoprolol succinate (TOPROL-XL) 25 MG 24 hr tablet Take 1 tablet by mouth daily.   Multiple Vitamins-Minerals (PRESERVISION AREDS PO) Take by mouth.   No facility-administered medications prior to visit.    Review of Systems All negative Except see HPI       Objective    There were no vitals taken for this visit.    Physical Exam Vitals reviewed.  Constitutional:      General: She is not in acute distress.    Appearance: Normal appearance. She is well-developed. She is not diaphoretic.  HENT:     Head: Normocephalic and atraumatic.   Eyes:     General: No scleral icterus.    Conjunctiva/sclera: Conjunctivae normal.   Neck:     Thyroid : No thyromegaly.   Cardiovascular:     Rate and Rhythm: Normal rate and regular rhythm.     Pulses: Normal pulses.     Heart sounds:  Normal heart sounds. No murmur heard. Pulmonary:     Effort: Pulmonary effort is normal. No respiratory distress.     Breath sounds: Normal breath sounds. No wheezing, rhonchi or rales.   Musculoskeletal:     Cervical back: Neck supple.     Right lower leg: No edema.     Left lower leg: No edema.  Lymphadenopathy:     Cervical: No cervical adenopathy.   Skin:    General: Skin is warm and dry.     Findings: No rash.   Neurological:     Mental Status: She is alert and oriented to person, place, and time. Mental status is at baseline.   Psychiatric:        Mood and Affect: Mood normal.        Behavior: Behavior normal.      No results found for any visits on 10/08/23.      Assessment & Plan      Rectal Bleeding Intermittent rectal bleeding with bright red blood, likely due to internal  hemorrhoids, constipation and recent meloxicam  use.  Per triage, mild, about the size of a tablespoon or less. Not on blood thinners. Denies fever, weakness, faintness, dizziness, nausea, vomiting. Colonoscopy from 2017 showed internal hemorrhoids. Differential includes hemorrhoidal versus other gastrointestinal sources. - Perform stool test for occult blood. - Order blood work for anemia and electrolytes. - Check coagulation profile. - Refer to gastroenterology for further evaluation.  Plantar Fasciitis Chronic plantar fasciitis with previous corticosteroid injections and meloxicam , managed by a specialist with intermittent relief. Follow up with podiatry, Dr. Silva  Constipation/Bowel Regularity Chronic Desires regular bowel movements; current diet may lack fiber. Reports loose stools. - Advise high fiber diet. - Recommend Metamucil if needed. - Encourage increased water intake. - Suggest adding yogurt to diet.      Rectal bleeding (Primary)  - CBC with Differential/Platelet - Comprehensive metabolic panel with GFR - PT and PTT - Fibrinogen - Ambulatory referral to Gastroenterology  Constipation, unspecified constipation type  Pt was advised: -Eliminate medications that cause constipation. -Increase fluid intake. -Increase soluble fiber (25-30g/day) in diet. -Encourage regular defecation attempts after eating. -Regular exercise -Enemas if other methods fail- Bulking agents (accompanied by adequate fluids)  Psyllium  (Konsyl, Metamucil, Perdiem Fiber): 1 tbsp (approx 3.5 grams fiber) in 8-oz liquid PO daily up to TID Pt instructions/high-fiber eating plan were provided  - CBC with Differential/Platelet - Comprehensive metabolic panel with GFR - PT and PTT - Fibrinogen - Ambulatory referral to Gastroenterology  Adaptive colitis Was diagnosed in 2017 - CBC with Differential/Platelet - Comprehensive metabolic panel with GFR - PT and PTT - Fibrinogen - Ambulatory  referral to Gastroenterology  Hypertension Chronic BP today was 143/81, not at goal Continue lifestyle modifications Continue metoprolol 25mg  Will follow-up   No orders of the defined types were placed in this encounter.   No follow-ups on file.   The patient was advised to call back or seek an in-person evaluation if the symptoms worsen or if the condition fails to improve as anticipated.  I discussed the assessment and treatment plan with the patient. The patient was provided an opportunity to ask questions and all were answered. The patient agreed with the plan and demonstrated an understanding of the instructions.  I, Jarnell Cordaro, PA-C have reviewed all documentation for this visit. The documentation on 10/08/2023  for the exam, diagnosis, procedures, and orders are all accurate and complete.  Jolynn Spencer, [facility], MMS [facility] [phone] (phone)  [phone] (fax)  [facility]

## 2023-10-08 ENCOUNTER — Encounter: Payer: Self-pay | Admitting: Physician Assistant

## 2023-10-08 ENCOUNTER — Ambulatory Visit: Admitting: Physician Assistant

## 2023-10-08 VITALS — BP 143/81 | HR 74 | Resp 16 | Ht 65.0 in | Wt 199.0 lb

## 2023-10-08 DIAGNOSIS — K5900 Constipation, unspecified: Secondary | ICD-10-CM

## 2023-10-08 DIAGNOSIS — K589 Irritable bowel syndrome without diarrhea: Secondary | ICD-10-CM

## 2023-10-08 DIAGNOSIS — K625 Hemorrhage of anus and rectum: Secondary | ICD-10-CM

## 2023-10-09 ENCOUNTER — Ambulatory Visit: Payer: Self-pay | Admitting: Physician Assistant

## 2023-10-09 LAB — CBC WITH DIFFERENTIAL/PLATELET
Basophils Absolute: 0 10*3/uL (ref 0.0–0.2)
Basos: 0 %
EOS (ABSOLUTE): 0.1 10*3/uL (ref 0.0–0.4)
Eos: 2 %
Hematocrit: 43.1 % (ref 34.0–46.6)
Hemoglobin: 14.3 g/dL (ref 11.1–15.9)
Immature Grans (Abs): 0 10*3/uL (ref 0.0–0.1)
Immature Granulocytes: 0 %
Lymphocytes Absolute: 1.1 10*3/uL (ref 0.7–3.1)
Lymphs: 15 %
MCH: 31.4 pg (ref 26.6–33.0)
MCHC: 33.2 g/dL (ref 31.5–35.7)
MCV: 95 fL (ref 79–97)
Monocytes Absolute: 0.8 10*3/uL (ref 0.1–0.9)
Monocytes: 11 %
Neutrophils Absolute: 5.5 10*3/uL (ref 1.4–7.0)
Neutrophils: 72 %
Platelets: 229 10*3/uL (ref 150–450)
RBC: 4.55 x10E6/uL (ref 3.77–5.28)
RDW: 12.6 % (ref 11.7–15.4)
WBC: 7.6 10*3/uL (ref 3.4–10.8)

## 2023-10-09 LAB — COMPREHENSIVE METABOLIC PANEL WITH GFR
ALT: 19 IU/L (ref 0–32)
AST: 22 IU/L (ref 0–40)
Albumin: 4.6 g/dL (ref 3.8–4.8)
Alkaline Phosphatase: 82 IU/L (ref 44–121)
BUN/Creatinine Ratio: 29 — ABNORMAL HIGH (ref 12–28)
BUN: 20 mg/dL (ref 8–27)
Bilirubin Total: 0.6 mg/dL (ref 0.0–1.2)
CO2: 20 mmol/L (ref 20–29)
Calcium: 9.7 mg/dL (ref 8.7–10.3)
Chloride: 107 mmol/L — ABNORMAL HIGH (ref 96–106)
Creatinine, Ser: 0.68 mg/dL (ref 0.57–1.00)
Globulin, Total: 1.9 g/dL (ref 1.5–4.5)
Glucose: 83 mg/dL (ref 70–99)
Potassium: 4.7 mmol/L (ref 3.5–5.2)
Sodium: 142 mmol/L (ref 134–144)
Total Protein: 6.5 g/dL (ref 6.0–8.5)
eGFR: 90 mL/min/{1.73_m2} (ref >59)

## 2023-10-09 LAB — PT AND PTT
INR: 1 (ref 0.9–1.2)
Prothrombin Time: 11.7 s (ref 9.1–12.0)
aPTT: 28 s (ref 24–33)

## 2023-10-09 LAB — FIBRINOGEN: Fibrinogen: 345 mg/dL (ref 193–507)

## 2023-10-11 LAB — IFOBT (OCCULT BLOOD): IFOBT: POSITIVE

## 2023-10-11 NOTE — Addendum Note (Signed)
 Addended by: Lamarion Mcevers E on: 10/11/2023 01:13 PM   Modules accepted: Orders

## 2023-11-03 ENCOUNTER — Encounter: Payer: Self-pay | Admitting: Gastroenterology

## 2023-11-04 ENCOUNTER — Encounter
Admission: RE | Disposition: A | Discharge status: Home / Self Care | Payer: Self-pay | Source: Ambulatory Visit | Attending: Gastroenterology

## 2023-11-04 ENCOUNTER — Other Ambulatory Visit: Payer: Self-pay

## 2023-11-04 ENCOUNTER — Ambulatory Visit: Admitting: Certified Registered Nurse Anesthetist

## 2023-11-04 ENCOUNTER — Encounter: Payer: Self-pay | Admitting: Gastroenterology

## 2023-11-04 ENCOUNTER — Ambulatory Visit
Admission: RE | Admit: 2023-11-04 | Discharge: 2023-11-04 | Disposition: A | Discharge status: Home / Self Care | Source: Ambulatory Visit | Attending: Gastroenterology | Admitting: Gastroenterology

## 2023-11-04 DIAGNOSIS — K644 Residual hemorrhoidal skin tags: Secondary | ICD-10-CM | POA: Insufficient documentation

## 2023-11-04 DIAGNOSIS — I10 Essential (primary) hypertension: Secondary | ICD-10-CM | POA: Diagnosis not present

## 2023-11-04 DIAGNOSIS — K5730 Diverticulosis of large intestine without perforation or abscess without bleeding: Secondary | ICD-10-CM | POA: Insufficient documentation

## 2023-11-04 DIAGNOSIS — K625 Hemorrhage of anus and rectum: Secondary | ICD-10-CM | POA: Diagnosis present

## 2023-11-04 DIAGNOSIS — D124 Benign neoplasm of descending colon: Secondary | ICD-10-CM | POA: Diagnosis not present

## 2023-11-04 DIAGNOSIS — I4891 Unspecified atrial fibrillation: Secondary | ICD-10-CM | POA: Diagnosis not present

## 2023-11-04 DIAGNOSIS — Z87891 Personal history of nicotine dependence: Secondary | ICD-10-CM | POA: Diagnosis not present

## 2023-11-04 HISTORY — PX: COLONOSCOPY: SHX5424

## 2023-11-04 HISTORY — PX: POLYPECTOMY: SHX149

## 2023-11-04 HISTORY — DX: Unspecified atrial fibrillation: I48.91

## 2023-11-04 SURGERY — COLONOSCOPY
Anesthesia: General

## 2023-11-04 MED ORDER — PROPOFOL 10 MG/ML IV BOLUS
INTRAVENOUS | Status: DC | PRN
Start: 2023-11-04 — End: 2023-11-04
  Administered 2023-11-04: 70 mg via INTRAVENOUS

## 2023-11-04 MED ORDER — PROPOFOL 500 MG/50ML IV EMUL
INTRAVENOUS | Status: DC | PRN
  Administered 2023-11-04: 160 ug/kg/min via INTRAVENOUS

## 2023-11-04 MED ORDER — SODIUM CHLORIDE 0.9 % IV SOLN: INTRAVENOUS | Status: DC

## 2023-11-04 NOTE — Transfer of Care (Signed)
 Immediate Anesthesia Transfer of Care Note  Patient: Melissa Barry  Procedure(s) Performed: COLONOSCOPY POLYPECTOMY, INTESTINE  Patient Location: PACU  Anesthesia Type:General  Level of Consciousness: awake, alert , and oriented  Airway & Oxygen Therapy: Patient Spontanous Breathing and Patient connected to nasal cannula oxygen  Post-op Assessment: Report given to RN and Post -op Vital signs reviewed and stable  Post vital signs: Reviewed and stable  Last Vitals:  Vitals Value Taken Time  BP 131/69 11/04/23 09:02  Temp    Pulse 71 11/04/23 09:03  Resp 15 11/04/23 09:03  SpO2 99 % 11/04/23 09:03  Vitals shown include unfiled device data.  Last Pain:  Vitals:   11/04/23 0752  TempSrc: Temporal  PainSc: 0-No pain         Complications: No notable events documented.

## 2023-11-04 NOTE — Anesthesia Postprocedure Evaluation (Signed)
 Anesthesia Post Note  Patient: Zada H [facility]  Procedure(s) Performed: COLONOSCOPY POLYPECTOMY, INTESTINE  Patient location during evaluation: PACU Anesthesia Type: General Level of consciousness: awake and alert Pain management: pain level controlled Vital Signs Assessment: post-procedure vital signs reviewed and stable Respiratory status: spontaneous breathing, nonlabored ventilation, respiratory function stable and patient connected to nasal cannula oxygen Cardiovascular status: blood pressure returned to baseline and stable Postop Assessment: no apparent nausea or vomiting Anesthetic complications: no   No notable events documented.   Last Vitals:  Vitals:   11/04/23 0910 11/04/23 0921  BP: (!) 151/78 137/75  Pulse: 75 70  Resp: 18 20  Temp: (!) 36 C (!) 36 C  SpO2: 98% 100%    Last Pain:  Vitals:   11/04/23 0921  TempSrc: Temporal  PainSc: 0-No pain                 Debby Mines

## 2023-11-04 NOTE — H&P (Signed)
 [name], MD [facility], DHIP [address]  [place], [place] [address]  Main: [phone] Fax:  [phone] Pager: [phone]   Primary Care Physician:  Myrla Jon HERO, MD Primary Gastroenterologist:  Dr. [name]  Pre-Procedure History & Physical: HPI:  Melissa Barry is a 78 y.o. female is here for an colonoscopy.   Past Medical History:  Diagnosis Date   A-fib [facility])    Diagnosed a couple years ago   Arthritis    Medical history non-contributory    Primary hypertension 10/31/2020    Past Surgical History:  Procedure Laterality Date   BREAST BIOPSY Left years ago   Dr. Dessa    CATARACT EXTRACTION W/PHACO Right 10/29/2020   Procedure: CATARACT EXTRACTION PHACO AND INTRAOCULAR LENS PLACEMENT (IOC) RIGHT VIVITY LENS 8.96 01:00.6;  Surgeon: Jaye Fallow, MD;  Location: [facility];  Service: Ophthalmology;  Laterality: Right;  DEXTENZA    CATARACT EXTRACTION W/PHACO Left 11/12/2020   Procedure: CATARACT EXTRACTION PHACO AND INTRAOCULAR LENS PLACEMENT (IOC) LEFT VIVITY LENS 4.73 00:31.0;  Surgeon: Jaye Fallow, MD;  Location: [facility];  Service: Ophthalmology;  Laterality: Left;  DEXTENZA    COLONOSCOPY WITH PROPOFOL  N/A 12/17/2015   Procedure: COLONOSCOPY WITH PROPOFOL ;  Surgeon: Rogelia Copping, MD;  Location: [facility];  Service: Endoscopy;  Laterality: N/A;   DILATION AND CURETTAGE OF UTERUS     EYE SURGERY Right 10/29/20 & 11/12/20   cataract   TONSILLECTOMY AND ADENOIDECTOMY      Prior to Admission medications   Medication Sig Start Date End Date Taking? Authorizing Provider  atorvastatin (LIPITOR) 40 MG tablet Take 40 mg by mouth daily. 11/28/22   [provider]  metoprolol succinate (TOPROL-XL) 25 MG 24 hr tablet Take 1 tablet by mouth daily. 05/15/21 04/28/24  [provider]  Multiple Vitamins-Minerals (PRESERVISION AREDS PO) Take by mouth.    [provider]     Allergies as of 10/18/2023 - Review Complete 10/08/2023  Allergen Reaction Noted   Doxycycline Other (See Comments) 10/11/2014   Penicillins Rash 10/11/2014   Sulfa antibiotics Swelling and Rash 10/11/2014    Family History  Problem Relation Age of Onset   Ovarian cancer Mother    Cancer Mother    Heart disease Father    Lung cancer Father    Thyroid  disease Father    Congestive Heart Failure Father    Cancer Father    Hepatitis Sister    Celiac disease Sister    Migraines Daughter    Migraines Son    Sarcoidosis Son    Parkinson's disease Maternal Grandfather    Stroke Paternal Grandmother    Lung cancer Paternal Grandfather     Social History   Socioeconomic History   Marital status: Married    Spouse name: Not on file   Number of children: 2   Years of education: Not on file   Highest education level: Some college, no degree  Occupational History   Occupation: retired  Tobacco Use   Smoking status: Former    Types: Cigarettes   Smokeless tobacco: Never   Tobacco comments:    only smoked in [employer], [employer]   Vaping status: Never Used  Substance and Sexual Activity   Alcohol use: Yes    Alcohol/week: 0.0 - 4.0 standard drinks of alcohol    Comment: a couple drinks a month   Drug use: No   Sexual activity: Yes  Other Topics Concern   Not  on file  Social History Narrative   Not on file   Social Drivers of Health   Financial Resource Strain: Low Risk  (10/04/2023)   Overall Financial Resource Strain (CARDIA)    Difficulty of Paying Living Expenses: Not hard at all  Food Insecurity: No Food Insecurity (10/04/2023)   Hunger Vital Sign    Worried About Running Out of Food in the Last Year: Never true    Ran Out of Food in the Last Year: Never true  Transportation Needs: No Transportation Needs (10/04/2023)   PRAPARE - Administrator, Civil Service (Medical): No    Lack of Transportation (Non-Medical): No  Physical Activity: Inactive  (10/04/2023)   Exercise Vital Sign    Days of Exercise per Week: 0 days    Minutes of Exercise per Session: Not on file  Stress: No Stress Concern Present (10/04/2023)   [employer] of Occupational Health - Occupational Stress Questionnaire    Feeling of Stress: Not at all  Social Connections: Moderately Isolated (10/04/2023)   Social Connection and Isolation Panel    Frequency of Communication with Friends and Family: Once a week    Frequency of Social Gatherings with Friends and Family: Once a week    Attends Religious Services: 1 to 4 times per year    Active Member of [employer] or Organizations: No    Attends Banker Meetings: Not on file    Marital Status: Married  Catering manager Violence: Not At Risk (03/07/2020)   Humiliation, Afraid, Rape, and Kick questionnaire    Fear of Current or Ex-Partner: No    Emotionally Abused: No    Physically Abused: No    Sexually Abused: No    Review of Systems: See HPI, otherwise negative ROS  Physical Exam: BP (!) 167/70   Pulse 74   Temp (!) 96.3 F (35.7 C) (Temporal)   Resp 18   Ht 5' 5 (1.651 m)   Wt 90.3 kg   SpO2 99%   BMI 33.12 kg/m  General:   Alert,  pleasant and cooperative in NAD Head:  Normocephalic and atraumatic. Neck:  Supple; no masses or thyromegaly. Lungs:  Clear throughout to auscultation.    Heart:  Regular rate and rhythm. Abdomen:  Soft, nontender and nondistended. Normal bowel sounds, without guarding, and without rebound.   Neurologic:  Alert and  oriented x4;  grossly normal neurologically.  Impression/Plan: Melissa Barry is here for an colonoscopy to be performed for rectal bleeding  Risks, benefits, limitations, and alternatives regarding  colonoscopy have been reviewed with the patient.  Questions have been answered.  All parties agreeable.   Corinn Brooklyn, MD  11/04/2023, 8:21 AM

## 2023-11-04 NOTE — Op Note (Signed)
 [facility] Gastroenterology Patient Name: Melissa Barry Procedure Date: 11/04/2023 8:34 AM MRN: [id number] Account #: [id number] Date of Birth: 12-25-1945 Admit Type: Outpatient Age: 78 Room: [facility] ENDO ROOM 3 Gender: Female Note Status: Finalized Instrument Name: Colonoscope [id number] Procedure:             Colonoscopy Indications:           Last colonoscopy: August 2017, Rectal bleeding Providers:             Corinn Jess Brooklyn MD, MD Referring MD:          Jon HERO. Bacigalupo (Referring MD) Medicines:             General Anesthesia Complications:         No immediate complications. Estimated blood loss:                         Minimal. Procedure:             Pre-Anesthesia Assessment:                        - Prior to the procedure, a History and Physical was                         performed, and patient medications and allergies were                         reviewed. The patient is competent. The risks and                         benefits of the procedure and the sedation options and                         risks were discussed with the patient. All questions                         were answered and informed consent was obtained.                         Patient identification and proposed procedure were                         verified by the physician, the nurse, the                         anesthesiologist, the anesthetist and the technician                         in the pre-procedure area in the procedure room in the                         endoscopy suite. Mental Status Examination: alert and                         oriented. Airway Examination: normal oropharyngeal                         airway and neck mobility. Respiratory Examination:  clear to auscultation. CV Examination: normal.                         Prophylactic Antibiotics: The patient does not require                         prophylactic antibiotics. Prior  Anticoagulants: The                         patient has taken no anticoagulant or antiplatelet                         agents. ASA Grade Assessment: III - A patient with                         severe systemic disease. After reviewing the risks and                         benefits, the patient was deemed in satisfactory                         condition to undergo the procedure. The anesthesia                         plan was to use general anesthesia. Immediately prior                         to administration of medications, the patient was                         re-assessed for adequacy to receive sedatives. The                         heart rate, respiratory rate, oxygen saturations,                         blood pressure, adequacy of pulmonary ventilation, and                         response to care were monitored throughout the                         procedure. The physical status of the patient was                         re-assessed after the procedure.                        After obtaining informed consent, the colonoscope was                         passed under direct vision. Throughout the procedure,                         the patient's blood pressure, pulse, and oxygen                         saturations were monitored continuously. The  Colonoscope was introduced through the anus and                         advanced to the the cecum, identified by appendiceal                         orifice and ileocecal valve. The colonoscopy was                         performed without difficulty. The patient tolerated                         the procedure well. The quality of the bowel                         preparation was evaluated using the BBPS [facility] Bowel                         Preparation Scale) with scores of: Right Colon = 3,                         Transverse Colon = 3 and Left Colon = 3 (entire mucosa                         seen well with no  residual staining, small fragments                         of stool or opaque liquid). The total BBPS score                         equals 9. The ileocecal valve, appendiceal orifice,                         and rectum were photographed. Findings:      The perianal and digital rectal examinations were normal. Pertinent       negatives include normal sphincter tone and no palpable rectal lesions.      A 5 mm polyp was found in the descending colon. The polyp was sessile.       The polyp was removed with a cold snare. Resection and retrieval were       complete. Estimated blood loss: none.      Multiple diverticula were found in the recto-sigmoid colon and sigmoid       colon.      Non-bleeding external hemorrhoids were found during retroflexion. The       hemorrhoids were medium-sized. Impression:            - One 5 mm polyp in the descending colon, removed with                         a cold snare. Resected and retrieved.                        - Diverticulosis in the recto-sigmoid colon and in the                         sigmoid colon.                        -  Non-bleeding external hemorrhoids. Recommendation:        - Discharge patient to home (with escort).                        - Resume previous diet today.                        - Continue present medications.                        - Await pathology results. Procedure Code(s):     --- Professional ---                        [phone], Colonoscopy, flexible; with removal of                         tumor(s), polyp(s), or other lesion(s) by snare                         technique Diagnosis Code(s):     --- Professional ---                        D12.4, Benign neoplasm of descending colon                        K64.4, Residual hemorrhoidal skin tags                        K62.5, Hemorrhage of anus and rectum                        K57.30, Diverticulosis of large intestine without                         perforation or abscess without  bleeding CPT copyright 2022 American Medical Association. All rights reserved. The codes documented in this report are preliminary and upon coder review may  be revised to meet current compliance requirements. Dr. Corinn Brooklyn Corinn Jess Brooklyn MD, MD 11/04/2023 8:59:49 AM This report has been signed electronically. Number of Addenda: 0 Note Initiated On: 11/04/2023 8:34 AM Scope Withdrawal Time: 0 hours 10 minutes 18 seconds  Total Procedure Duration: 0 hours 13 minutes 57 seconds  Estimated Blood Loss:  Estimated blood loss: none.      [facility]

## 2023-11-04 NOTE — Anesthesia Procedure Notes (Signed)
 Date/Time: 11/04/2023 8:36 AM  Performed by: Duwayne Craven, CRNAPre-anesthesia Checklist: Patient identified, Emergency Drugs available, Suction available, Patient being monitored and Timeout performed Patient Re-evaluated:Patient Re-evaluated prior to induction Oxygen Delivery Method: Nasal cannula Induction Type: IV induction Placement Confirmation: CO2 detector and positive ETCO2

## 2023-11-04 NOTE — Anesthesia Preprocedure Evaluation (Signed)
 Anesthesia Evaluation  Patient identified by MRN, date of birth, ID band Patient awake    Reviewed: Allergy & Precautions, NPO status , Patient's Chart, lab work & pertinent test results  History of Anesthesia Complications Negative for: history of anesthetic complications  Airway Mallampati: IV  TM Distance: >3 FB Neck ROM: Full    Dental  (+) Chipped Upper bridge:   Pulmonary neg pulmonary ROS, former smoker   Pulmonary exam normal breath sounds clear to auscultation       Cardiovascular Exercise Tolerance: Good hypertension, + dysrhythmias Atrial Fibrillation  Rhythm:Regular Rate:Normal     Neuro/Psych negative neurological ROS  negative psych ROS   GI/Hepatic negative GI ROS, Neg liver ROS,,,  Endo/Other  negative endocrine ROS  Obesity - BMI > 30  Renal/GU negative Renal ROS  negative genitourinary   Musculoskeletal   Abdominal   Peds  Hematology negative hematology ROS (+)   Anesthesia Other Findings Past Medical History: No date: A-fib [facility])     Comment:  Diagnosed a couple years ago No date: Arthritis No date: Medical history non-contributory 10/31/2020: Primary hypertension  Past Surgical History: years ago: BREAST BIOPSY; Left     Comment:  Dr. Dessa  10/29/2020: CATARACT EXTRACTION W/PHACO; Right     Comment:  Procedure: CATARACT EXTRACTION PHACO AND INTRAOCULAR               LENS PLACEMENT (IOC) RIGHT VIVITY LENS 8.96 01:00.6;                Surgeon: Jaye Fallow, MD;  Location: [facility] SUR[facility]              CNTR;  Service: Ophthalmology;  Laterality: Right;                DEXTENZA  11/12/2020: CATARACT EXTRACTION W/PHACO; Left     Comment:  Procedure: CATARACT EXTRACTION PHACO AND INTRAOCULAR               LENS PLACEMENT (IOC) LEFT VIVITY LENS 4.73 00:31.0;                Surgeon: Jaye Fallow, MD;  Location: [facility] SURGERY              CNTR;  Service: Ophthalmology;  Laterality:  Left;                DEXTENZA  12/17/2015: COLONOSCOPY WITH PROPOFOL ; N/A     Comment:  Procedure: COLONOSCOPY WITH PROPOFOL ;  Surgeon: Rogelia Copping, MD;  Location: [facility] ENDOSCOPY;  Service: Endoscopy;              Laterality: N/A; No date: DILATION AND CURETTAGE OF UTERUS 10/29/20 & 11/12/20: EYE SURGERY; Right     Comment:  cataract No date: TONSILLECTOMY AND ADENOIDECTOMY  BMI    Body Mass Index: 33.12 kg/m      Reproductive/Obstetrics negative OB ROS                              Anesthesia Physical Anesthesia Plan  ASA: 3  Anesthesia Plan: General   Post-op Pain Management: Minimal or no pain anticipated   Induction: Intravenous  PONV Risk Score and Plan: 3 and TIVA, Midazolam  and Treatment may vary due to age or medical condition  Airway Management Planned: Nasal Cannula  Additional Equipment: None  Intra-op Plan:   Post-operative Plan:   Informed Consent:  I have reviewed the patients History and Physical, chart, labs and discussed the procedure including the risks, benefits and alternatives for the proposed anesthesia with the patient or authorized representative who has indicated his/her understanding and acceptance.     Dental advisory given  Plan Discussed with: CRNA  Anesthesia Plan Comments: (Discussed risks of anesthesia with patient, including possibility of difficulty with spontaneous ventilation under anesthesia necessitating airway intervention, PONV, and rare risks such as cardiac or respiratory or neurological events, and allergic reactions. Discussed the role of CRNA in patient's perioperative care. Patient understands.)        Anesthesia Quick Evaluation

## 2023-11-05 ENCOUNTER — Encounter: Payer: Self-pay | Admitting: Gastroenterology

## 2023-11-05 LAB — SURGICAL PATHOLOGY

## 2023-11-08 ENCOUNTER — Ambulatory Visit: Payer: Self-pay | Admitting: Gastroenterology

## 2023-11-08 NOTE — Progress Notes (Signed)
 Please inform patient that the pathology results from colonoscopy shows benign polyp.  Recommend surveillance colonoscopy in 5 years  Also, with regards to rectal bleeding secondary to hemorrhoids, please schedule outpatient hemorrhoid ligation if patient is agreeable  Melissa Barry

## 2023-11-24 ENCOUNTER — Other Ambulatory Visit: Payer: Self-pay | Admitting: Nurse Practitioner

## 2023-11-24 DIAGNOSIS — M79605 Pain in left leg: Secondary | ICD-10-CM

## 2023-11-25 ENCOUNTER — Ambulatory Visit
Admission: RE | Admit: 2023-11-25 | Discharge: 2023-11-25 | Disposition: A | Discharge status: Home / Self Care | Source: Ambulatory Visit | Attending: Nurse Practitioner | Admitting: Nurse Practitioner

## 2023-11-25 DIAGNOSIS — M79605 Pain in left leg: Secondary | ICD-10-CM | POA: Insufficient documentation

## 2023-11-26 ENCOUNTER — Encounter: Payer: Self-pay | Admitting: Nurse Practitioner

## 2023-11-28 ENCOUNTER — Other Ambulatory Visit: Payer: Self-pay

## 2023-11-28 ENCOUNTER — Emergency Department
Admission: EM | Admit: 2023-11-28 | Discharge: 2023-11-28 | Disposition: A | Discharge status: Home / Self Care | Attending: Emergency Medicine | Admitting: Emergency Medicine

## 2023-11-28 ENCOUNTER — Emergency Department

## 2023-11-28 DIAGNOSIS — I4891 Unspecified atrial fibrillation: Secondary | ICD-10-CM | POA: Insufficient documentation

## 2023-11-28 DIAGNOSIS — R000 Tachycardia, unspecified: Secondary | ICD-10-CM | POA: Diagnosis present

## 2023-11-28 DIAGNOSIS — I10 Essential (primary) hypertension: Secondary | ICD-10-CM | POA: Insufficient documentation

## 2023-11-28 LAB — CBC
HCT: 44.6 % (ref 36.0–46.0)
Hemoglobin: 14.9 g/dL (ref 12.0–15.0)
MCH: 31 pg (ref 26.0–34.0)
MCHC: 33.4 g/dL (ref 30.0–36.0)
MCV: 92.9 fL (ref 80.0–100.0)
Platelets: 179 K/uL (ref 150–400)
RBC: 4.8 MIL/uL (ref 3.87–5.11)
RDW: 12.2 % (ref 11.5–15.5)
WBC: 6 K/uL (ref 4.0–10.5)
nRBC: 0 % (ref 0.0–0.2)

## 2023-11-28 LAB — BASIC METABOLIC PANEL WITH GFR
Anion gap: 10 (ref 5–15)
BUN: 23 mg/dL (ref 8–23)
CO2: 22 mmol/L (ref 22–32)
Calcium: 9.2 mg/dL (ref 8.9–10.3)
Chloride: 106 mmol/L (ref 98–111)
Creatinine, Ser: 0.86 mg/dL (ref 0.44–1.00)
GFR, Estimated: >60 mL/min (ref >60)
Glucose, Bld: 123 mg/dL — ABNORMAL HIGH (ref 70–99)
Potassium: 3.6 mmol/L (ref 3.5–5.1)
Sodium: 138 mmol/L (ref 135–145)

## 2023-11-28 LAB — MAGNESIUM: Magnesium: 2.3 mg/dL (ref 1.7–2.4)

## 2023-11-28 LAB — TROPONIN I (HIGH SENSITIVITY)
Troponin I (High Sensitivity): 6 ng/L (ref <18)
Troponin I (High Sensitivity): 10 ng/L (ref <18)

## 2023-11-28 MED ORDER — APIXABAN 5 MG PO TABS
5.0000 mg | ORAL_TABLET | Freq: Two times a day (BID) | ORAL | 0 refills | Status: AC
Start: 2023-11-28 — End: ?

## 2023-11-28 MED ORDER — DILTIAZEM HCL 25 MG/5ML IV SOLN
20.0000 mg | INTRAVENOUS | Status: AC
  Administered 2023-11-28: 20 mg via INTRAVENOUS
  Filled 2023-11-28: qty 5

## 2023-11-28 MED ORDER — DILTIAZEM HCL 25 MG/5ML IV SOLN
15.0000 mg | INTRAVENOUS | Status: AC
  Administered 2023-11-28: 15 mg via INTRAVENOUS
  Filled 2023-11-28: qty 5

## 2023-11-28 MED ORDER — DILTIAZEM HCL 60 MG PO TABS
30.0000 mg | ORAL_TABLET | ORAL | Status: AC
  Administered 2023-11-28: 30 mg via ORAL
  Filled 2023-11-28: qty 1

## 2023-11-28 NOTE — Discharge Instructions (Addendum)
 As we discussed, you were again in atrial fibrillation with a rapid rate and needed some extra medicine to bring your heart rate down.  You are still in atrial fibrillation at the time of discharge from the emergency department.  Since this is not a new problem for you, we discussed staying in the hospital, but we all agreed that it would be okay for you to go home and follow-up as an outpatient.  Please call your cardiology office and schedule the next available follow-up appointment.  You may also want to follow-up with your primary care provider.  Take your regular medications as prescribed including your Toprol-XL.  Of note, after we discussed it extensively, you agreed to start anticoagulation.  Please pick up your prescription for Eliquis  in the morning and take it according to the label instructions.  This is something to also discuss with your care providers when you follow-up.  Please remember that this medication increases your risk of bleeding, so please try to avoid activities that may increase your risk of falls or other injuries.  Return to the emergency department if you develop new or worsening symptoms that concern you.

## 2023-11-28 NOTE — ED Triage Notes (Signed)
 Pt presents via POV c/o left leg pain, headache, and feeling of heart fluttering while using the restroom.   Pt denies pain currently. Denies chest pain. Denies SOB. Denies headache.

## 2023-11-28 NOTE — ED Provider Notes (Signed)
 [facility] Provider Note    Event Date/Time   First MD Initiated Contact with Patient 11/28/23 0111     (approximate)   History   Tachycardia   HPI Melissa Barry is a 78 y.o. female who has a history of paroxysmal atrial fibrillation not on anticoagulation (she stated that she did not want to be on a blood thinner) and who has a history of peripheral neuropathy and hypertension.  She presents tonight for fluttering in her chest.  She said that she has been having some pain in her left leg for which she saw the PA at her cardiology clinic a couple of days ago.  She had an outpatient ultrasound and there is no evidence of DVT.  She has not felt exactly right over the last couple of days but without any specific concerns or symptoms other than the left leg pain until tonight.  She got up to go to the bathroom and while she was in the bathroom she started to feel a fast fluttering in her chest.  She is denying any pain and shortness of breath but continues to feel the fluttering/palpitations.  No recent medication changes.  She said that she is still taking the Toprol XL as previously prescribed by her cardiologist.     Physical Exam   Triage Vital Signs: ED Triage Vitals [11/28/23 0059]  Encounter Vitals Group     BP (!) 126/103     Girls Systolic BP Percentile      Girls Diastolic BP Percentile      Boys Systolic BP Percentile      Boys Diastolic BP Percentile      Pulse Rate (!) 101     Resp 16     Temp 98.9 F (37.2 C)     Temp Source Oral     SpO2 96 %     Weight      Height      Head Circumference      Peak Flow      Pain Score 0     Pain Loc      Pain Education      Exclude from Growth Chart     Most recent vital signs: Vitals:   11/28/23 0330 11/28/23 0345  BP: 122/64 108/64  Pulse: (!) 105 95  Resp: 17 20  Temp:    SpO2: 99% 100%    General: Awake, no obvious distress.  Appears generally comfortable, conversant, pleasant mood  and affect. CV:  Good peripheral perfusion.  Tachycardia with irregularly irregular rhythm Resp:  Normal effort. Speaking easily and comfortably, no accessory muscle usage nor intercostal retractions.  Lungs clear to auscultation. Abd:  No distention.  Other:  No peripheral swelling of either leg.   ED Results / Procedures / Treatments   Labs (all labs ordered are listed, but only abnormal results are displayed) Labs Reviewed  BASIC METABOLIC PANEL WITH GFR - Abnormal; Notable for the following components:      Result Value   Glucose, Bld 123 (*)    All other components within normal limits  CBC  MAGNESIUM  TROPONIN I (HIGH SENSITIVITY)  TROPONIN I (HIGH SENSITIVITY)     EKG  ED ECG REPORT #1 (triage) I, Darleene Dome, the attending physician, personally viewed and interpreted this ECG.  Date: 11/28/2023 EKG Time: 1:05 AM Rate: 143 Rhythm: A-fib with RVR QRS Axis: normal Intervals: Abnormal due to A-fib, otherwise unremarkable ST/T Wave abnormalities: Non-specific ST segment /  T-wave changes, but no clear evidence of acute ischemia. Narrative Interpretation: no definitive evidence of acute ischemia; does not meet STEMI criteria.   ED ECG REPORT #2 (after two rounds of IV diltiazem ) I, Darleene Dome, the attending physician, personally viewed and interpreted this ECG.  Date: 11/28/2023 EKG Time: 3:06 AM Rate: 98 Rhythm: Atrial fibrillation QRS Axis: normal Intervals: Abnormal due to A-fib, otherwise unremarkable ST/T Wave abnormalities: Non-specific ST segment / T-wave changes, but no clear evidence of acute ischemia. Narrative Interpretation: no definitive evidence of acute ischemia; does not meet STEMI criteria.    PROCEDURES:  Critical Care performed: Yes, see critical care procedure note(s)  .Critical Care  Performed by: Dome Darleene, MD Authorized by: Dome Darleene, MD   Critical care provider statement:    Critical care time (minutes):  30   Critical  care time was exclusive of:  Separately billable procedures and treating other patients   Critical care was necessary to treat or prevent imminent or life-threatening deterioration of the following conditions:  Circulatory failure   Critical care was time spent personally by me on the following activities:  Development of treatment plan with patient or surrogate, evaluation of patient's response to treatment, examination of patient, obtaining history from patient or surrogate, ordering and performing treatments and interventions, ordering and review of laboratory studies, ordering and review of radiographic studies, pulse oximetry, re-evaluation of patient's condition and review of old charts .1-3 Lead EKG Interpretation  Performed by: Dome Darleene, MD Authorized by: Dome Darleene, MD     Interpretation: abnormal     ECG rate:  97   ECG rate assessment: normal     Rhythm: atrial fibrillation     Ectopy: none     Conduction: normal       IMPRESSION / MDM / ASSESSMENT AND PLAN / ED COURSE  I reviewed the triage vital signs and the nursing notes.                              Differential diagnosis includes, but is not limited to, A-fib, electrolyte or metabolic abnormality, ACS  Patient's presentation is most consistent with acute presentation with potential threat to life or bodily function.  Labs/studies ordered: High-sensitivity troponin x 2, magnesium, basic metabolic panel, CBC, EKG x 2  Interventions/Medications given:  Medications  diltiazem  (CARDIZEM ) injection 15 mg (15 mg Intravenous Given 11/28/23 0124)  diltiazem  (CARDIZEM ) tablet 30 mg (30 mg Oral Given 11/28/23 0147)  diltiazem  (CARDIZEM ) injection 20 mg (20 mg Intravenous Given 11/28/23 0228)    (Note:  hospital course my include additional interventions and/or labs/studies not listed above.)   Patient has a history of atrial fibrillation.  I read the clinic note from 3 to 4 days ago written by Ronal Counter, cardiology  PA, when the patient's history of paroxysmal A-fib with RVR was mentioned dating back to at least 2023 and possibly as far back as 2019.  Patient had an ultrasound that was negative for DVT and she will follow-up with vascular surgery for chronic venous insufficiency.  Mention was also made of her CHA2DS2-VASc score of 3 and her refusal for anticoagulation.  I will treat with diltiazem  15 mg IV since she is not a good candidate for electrical cardioversion and see if we can reduce her rate and/or convert her to normal sinus.  She agrees with the plan.  The patient is on the cardiac monitor to evaluate for evidence of arrhythmia  and/or significant heart rate changes.   Clinical Course as of 11/28/23 0407  Melissa Barry Nov 28, 2023  0152 Heart rate has come down to the 90s, still in A-fib, I ordered diltiazem  30 mg immediate release tablet [CF]  0204 Lab work including electrolytes are all reassuring and within normal limits [CF]  0220 Patient still in atrial fibrillation, heart rate has improved but is still bouncing between about 100 and 130.  Highly irregular rate.  Blood pressure is still appropriate and I will try another dose of diltiazem , this time 20 mg IV [CF]  0400 Patient's heart rate is now down in the 80s to 90s, still in A-fib.  I had an extensive conversation with her and her husband.  They are supposed to leave on an anniversary trip tomorrow.  She does not want to be in the hospital.  I think it is reasonable to not hospitalize her even though I considered and offered it, given that her history of atrial fibrillation is well-documented.  They note that if she starts being more symptomatic she should go to the nearest emergency department.  However I think her risk of an emergent or potentially life-threatening issue such as ACS is extremely low.  We also readdressed the issue of anticoagulation.  I talked with her at great length and she somewhat reluctantly agreed that she would start taking  Eliquis  (she admits that her cardiologists have toldher for years that she should be on anticoagulation).    We will not make any changes to her medications at this time and I told her to take her Toprol XL in the morning as scheduled.  She will follow-up with her cardiologist at the next available opportunity and I will send a message to her cardiology PA through [facility] to let her know about the visit and the medication change.  I gave strict return precautions. [CF]    Clinical Course User Index [CF] Gordan Huxley, MD     FINAL CLINICAL IMPRESSION(S) / ED DIAGNOSES   Final diagnoses:  Atrial fibrillation with RVR (HCC)     Rx / DC Orders   ED Discharge Orders          Ordered    apixaban  (ELIQUIS ) 5 MG TABS tablet  2 times daily        11/28/23 0406             Note:  This document was prepared using Dragon voice recognition software and may include unintentional dictation errors.   Gordan Huxley, MD 11/28/23 [phone]

## 2023-11-30 ENCOUNTER — Other Ambulatory Visit: Payer: Self-pay

## 2023-11-30 ENCOUNTER — Emergency Department

## 2023-11-30 ENCOUNTER — Emergency Department
Admission: EM | Admit: 2023-11-30 | Discharge: 2023-11-30 | Disposition: A | Discharge status: Home / Self Care | Source: Ambulatory Visit | Attending: Emergency Medicine | Admitting: Emergency Medicine

## 2023-11-30 DIAGNOSIS — I4891 Unspecified atrial fibrillation: Secondary | ICD-10-CM

## 2023-11-30 DIAGNOSIS — I10 Essential (primary) hypertension: Secondary | ICD-10-CM | POA: Insufficient documentation

## 2023-11-30 DIAGNOSIS — R002 Palpitations: Secondary | ICD-10-CM | POA: Diagnosis present

## 2023-11-30 LAB — COMPREHENSIVE METABOLIC PANEL WITH GFR
ALT: 21 U/L (ref 0–44)
AST: 25 U/L (ref 15–41)
Albumin: 4.3 g/dL (ref 3.5–5.0)
Alkaline Phosphatase: 63 U/L (ref 38–126)
Anion gap: 8 (ref 5–15)
BUN: 18 mg/dL (ref 8–23)
CO2: 25 mmol/L (ref 22–32)
Calcium: 9.6 mg/dL (ref 8.9–10.3)
Chloride: 108 mmol/L (ref 98–111)
Creatinine, Ser: 0.84 mg/dL (ref 0.44–1.00)
GFR, Estimated: >60 mL/min (ref >60)
Glucose, Bld: 106 mg/dL — ABNORMAL HIGH (ref 70–99)
Potassium: 4.2 mmol/L (ref 3.5–5.1)
Sodium: 141 mmol/L (ref 135–145)
Total Bilirubin: 0.8 mg/dL (ref 0.0–1.2)
Total Protein: 7 g/dL (ref 6.5–8.1)

## 2023-11-30 LAB — CBC
HCT: 43 % (ref 36.0–46.0)
Hemoglobin: 14.7 g/dL (ref 12.0–15.0)
MCH: 31.3 pg (ref 26.0–34.0)
MCHC: 34.2 g/dL (ref 30.0–36.0)
MCV: 91.7 fL (ref 80.0–100.0)
Platelets: 223 K/uL (ref 150–400)
RBC: 4.69 MIL/uL (ref 3.87–5.11)
RDW: 12.2 % (ref 11.5–15.5)
WBC: 6.8 K/uL (ref 4.0–10.5)
nRBC: 0 % (ref 0.0–0.2)

## 2023-11-30 LAB — PROTIME-INR
INR: 1.2 (ref 0.8–1.2)
Prothrombin Time: 15.7 s — ABNORMAL HIGH (ref 11.4–15.2)

## 2023-11-30 LAB — TROPONIN I (HIGH SENSITIVITY)
Troponin I (High Sensitivity): 5 ng/L (ref <18)
Troponin I (High Sensitivity): 5 ng/L (ref <18)

## 2023-11-30 MED ORDER — DILTIAZEM HCL ER COATED BEADS 120 MG PO CP24
120.0000 mg | ORAL_CAPSULE | Freq: Every day | ORAL | 2 refills | Status: AC
Start: 2023-11-30 — End: 2024-11-29

## 2023-11-30 MED ORDER — DILTIAZEM HCL ER COATED BEADS 120 MG PO CP24
120.0000 mg | ORAL_CAPSULE | Freq: Once | ORAL | Status: AC
  Administered 2023-11-30 (×2): 120 mg via ORAL
  Filled 2023-11-30 (×2): qty 1

## 2023-11-30 MED ORDER — SODIUM CHLORIDE 0.9 % IV BOLUS
500.0000 mL | Freq: Once | INTRAVENOUS | Status: AC
  Administered 2023-11-30 (×2): 500 mL via INTRAVENOUS

## 2023-11-30 MED ORDER — DILTIAZEM HCL 25 MG/5ML IV SOLN
10.0000 mg | Freq: Once | INTRAVENOUS | Status: AC
  Administered 2023-11-30 (×2): 10 mg via INTRAVENOUS
  Filled 2023-11-30: qty 5

## 2023-11-30 NOTE — Discharge Instructions (Addendum)
 As we discussed during tomorrow please begin taking your prescribed diltiazem  120 mg once daily capsule.  Please discontinue metoprolol and continue to take your blood thinner.  Return to the emergency department for any rapid heart rate shortness of breath chest pain or any other symptom personally concerning to yourself.

## 2023-11-30 NOTE — ED Triage Notes (Signed)
 Pt presents to the ED via POV from home with husband for palpitations. Hx of afib. Pt was seen here Sunday night for same. Pt takes metoprolol and has taken it as prescribed. Pt was also recently placed on a blood thinner. Pt's HR between 130's-160's. Pt A&Ox4. BP stable. Pt ambulatory to triage. Pt assessed by PA at time of triage.

## 2023-11-30 NOTE — ED Provider Triage Note (Signed)
 Emergency Medicine Provider Triage Evaluation Note  Melissa Barry , a 78 y.o. female  was evaluated in triage.  Pt complains of palpitation, was seen here last Sunday with diagnosed A-fib patient is taking Eliquis  and metoprolol.  States having left temporal headache.  Denies chest pain, dizziness  Review of Systems  Positive:  Negative:   Physical Exam  BP (!) 126/108   Pulse (!) 162   Temp 98.3 F (36.8 C)   Resp 18   SpO2 97% in triage patient was tachycardic, with diastolic hypertension Gen:   Awake, no distress   Resp:  Normal effort  MSK:   Moves extremities without difficulty Other:    Medical Decision Making  Medically screening exam initiated at 4:38 PM.  Appropriate orders placed.  Melissa Barry was informed that the remainder of the evaluation will be completed by another provider, this initial triage assessment does not replace that evaluation, and the importance of remaining in the ED until their evaluation is complete.  Who presents today with symptoms of a fever, tachycardic.  Denies chest pain, , dizziness.  Order CBC, CMP, EKG, troponin   Melissa Kast, PA-C 11/30/23 1640

## 2023-11-30 NOTE — ED Notes (Signed)
 Questions and concerns addressed. Discharge teaching completed.   Prescriptions reviewed and pharmacy verified.   Pt ambulatory upon discharge.

## 2023-11-30 NOTE — ED Provider Notes (Signed)
 [facility] Provider Note    Event Date/Time   First MD Initiated Contact with Patient 11/30/23 1700     (approximate)  History   Chief Complaint: Palpitations  HPI  Melissa Barry is a 78 y.o. female with a past medical history of atrial fibrillation, arthritis, hypertension, presents to the emergency department for worsening palpitations rapid heart rate.  According to the patient she was diagnosed several years ago with atrial fibrillation and has been on metoprolol once daily ever since.  Patient states the atrial fibrillation was coming and going however she was seen here several days ago and had persistent atrial fibrillation and was placed on Eliquis  which the patient has been taking.  Patient states today she felt like her heart was racing so she came to the emergency department for evaluation.  Patient denies any chest pain.  Physical Exam   Triage Vital Signs: ED Triage Vitals  Encounter Vitals Group     BP 11/30/23 1632 (!) 126/108     Girls Systolic BP Percentile --      Girls Diastolic BP Percentile --      Boys Systolic BP Percentile --      Boys Diastolic BP Percentile --      Pulse Rate 11/30/23 1632 (!) 162     Resp 11/30/23 1632 18     Temp 11/30/23 1632 98.3 F (36.8 C)     Temp src --      SpO2 11/30/23 1632 97 %     Weight 11/30/23 1638 199 lb 1.2 oz (90.3 kg)     Height 11/30/23 1638 5' 5 (1.651 m)     Head Circumference --      Peak Flow --      Pain Score 11/30/23 1633 0     Pain Loc --      Pain Education --      Exclude from Growth Chart --     Most recent vital signs: Vitals:   11/30/23 1800 11/30/23 1830  BP: 116/60 116/69  Pulse:  94  Resp: 11 12  Temp:    SpO2:  99%    General: Awake, no distress.  CV:  Good peripheral perfusion.  Irregular rhythm and rate 150 bpm. Resp:  Normal effort.  Equal breath sounds bilaterally.  Abd:  No distention.  Soft, nontender.  No rebound or guarding.  ED Results /  Procedures / Treatments   EKG  I have reviewed and interpreted the EKG which shows atrial fibrillation 156 bpm with a narrow QRS, normal axis, normal intervals, no concerning ST changes.  RADIOLOGY  I have reviewed and interpreted the chest x-ray images.  No significant findings on my evaluation. Radiology is read the x-ray is negative   MEDICATIONS ORDERED IN ED: Medications  diltiazem  (CARDIZEM  CD) 24 hr capsule 120 mg (has no administration in time range)  diltiazem  (CARDIZEM ) injection 10 mg (10 mg Intravenous Given 11/30/23 1739)  sodium chloride  0.9 % bolus 500 mL (0 mLs Intravenous Stopped 11/30/23 1912)     IMPRESSION / MDM / ASSESSMENT AND PLAN / ED COURSE  I reviewed the triage vital signs and the nursing notes.  Patient's presentation is most consistent with acute presentation with potential threat to life or bodily function.  Patient presents the emergency department for palpitations/rapid heart rate.  Patient found to be in atrial fibrillation with rapid ventricular response around 150 to 160 bpm.  Patient has been taking her metoprolol including this morning.  Patient workup otherwise is reassuring with a normal CBC normal chemistry negative troponin.  Patient given 10 mg of IV diltiazem  in the emergency department with good response.  Remains in atrial fibrillation but heart rate controlled between 85 and 95 bpm.  Will convert the patient to Cardizem  ER 120 mg daily.  I discussed with the patient to switch to Cardizem  ER instead of metoprolol.  Patient understands she is to discontinue metoprolol and will follow-up with her cardiologist.  Given the patient's reassuring workup I believe the patient will be safe for discharge home with outpatient follow-up.  CRITICAL CARE Performed by: Franky Moores   Total critical care time: 30 minutes  Critical care time was exclusive of separately billable procedures and treating other patients.  Critical care was necessary to  treat or prevent imminent or life-threatening deterioration.  Critical care was time spent personally by me on the following activities: development of treatment plan with patient and/or surrogate as well as nursing, discussions with consultants, evaluation of patient's response to treatment, examination of patient, obtaining history from patient or surrogate, ordering and performing treatments and interventions, ordering and review of laboratory studies, ordering and review of radiographic studies, pulse oximetry and re-evaluation of patient's condition.   FINAL CLINICAL IMPRESSION(S) / ED DIAGNOSES   Atrial fibrillation with rapid ventricular response   Note:  This document was prepared using Dragon voice recognition software and may include unintentional dictation errors.   Moores Franky, MD 11/30/23 1944

## 2023-12-02 ENCOUNTER — Other Ambulatory Visit: Payer: Self-pay

## 2023-12-02 ENCOUNTER — Emergency Department

## 2023-12-02 DIAGNOSIS — R002 Palpitations: Secondary | ICD-10-CM | POA: Diagnosis present

## 2023-12-02 DIAGNOSIS — Z7901 Long term (current) use of anticoagulants: Secondary | ICD-10-CM | POA: Insufficient documentation

## 2023-12-02 LAB — CBC
HCT: 37.6 % (ref 36.0–46.0)
Hemoglobin: 12.6 g/dL (ref 12.0–15.0)
MCH: 31.2 pg (ref 26.0–34.0)
MCHC: 33.5 g/dL (ref 30.0–36.0)
MCV: 93.1 fL (ref 80.0–100.0)
Platelets: 246 K/uL (ref 150–400)
RBC: 4.04 MIL/uL (ref 3.87–5.11)
RDW: 12.2 % (ref 11.5–15.5)
WBC: 5.8 K/uL (ref 4.0–10.5)
nRBC: 0 % (ref 0.0–0.2)

## 2023-12-02 LAB — PROTIME-INR
INR: 1.5 — ABNORMAL HIGH (ref 0.8–1.2)
Prothrombin Time: 18.8 s — ABNORMAL HIGH (ref 11.4–15.2)

## 2023-12-02 LAB — BASIC METABOLIC PANEL WITH GFR
Anion gap: 9 (ref 5–15)
BUN: 27 mg/dL — ABNORMAL HIGH (ref 8–23)
CO2: 23 mmol/L (ref 22–32)
Calcium: 9.2 mg/dL (ref 8.9–10.3)
Chloride: 108 mmol/L (ref 98–111)
Creatinine, Ser: 0.92 mg/dL (ref 0.44–1.00)
GFR, Estimated: >60 mL/min (ref >60)
Glucose, Bld: 155 mg/dL — ABNORMAL HIGH (ref 70–99)
Potassium: 3.6 mmol/L (ref 3.5–5.1)
Sodium: 140 mmol/L (ref 135–145)

## 2023-12-02 LAB — TROPONIN I (HIGH SENSITIVITY)
Troponin I (High Sensitivity): 4 ng/L (ref <18)
Troponin I (High Sensitivity): 4 ng/L (ref <18)

## 2023-12-02 NOTE — ED Triage Notes (Signed)
 Pt presents wit c/o palpitations. Pt has been here 2 times in the past week for same. Hx of afib. Denies pain. Denies SOB. Ambulatory to triage.

## 2023-12-03 ENCOUNTER — Emergency Department
Admission: EM | Admit: 2023-12-03 | Discharge: 2023-12-03 | Disposition: A | Discharge status: Home / Self Care | Attending: Emergency Medicine | Admitting: Emergency Medicine

## 2023-12-03 DIAGNOSIS — R002 Palpitations: Secondary | ICD-10-CM

## 2023-12-03 NOTE — Discharge Instructions (Signed)
 Your workup in the Emergency Department today was reassuring.  We did not find any specific abnormalities.  We recommend you drink plenty of fluids, take your regular medications and/or any new ones prescribed today, and follow up with the doctor(s) listed in these documents as recommended.  Return to the Emergency Department if you develop new or worsening symptoms that concern you.

## 2023-12-03 NOTE — ED Provider Notes (Signed)
 [facility] Provider Note    Event Date/Time   First MD Initiated Contact with Patient 12/03/23 0231     (approximate)   History   Palpitations   HPI Melissa Barry is a 78 y.o. female who presents for evaluation of palpitations.  She has been seen twice recently for issues associated with atrial fibrillation and RVR.  Tonight she does not feel like her heart rate is fast, but she feels like she is either skipping beats or having extra beats.  I saw her on the original visit and she agreed to start taking Eliquis  for persistent atrial fibrillation which she has had as a paroxysmal condition for an extended period of time.  She then came back when she was again in RVR and the ED physician changed her to extended release Cardizem  from metoprolol XL.  She says she has been taking her medication and she has a follow-up appointment with her cardiology PA in about 3 days.  However she was concerned tonight because of the feeling of skipping beats.  She has been fine since she came to the hospital.  No chest pain, no shortness of breath, no nausea or vomiting.  She just wanted to make sure everything was okay.     Physical Exam   Triage Vital Signs: ED Triage Vitals [12/02/23 2048]  Encounter Vitals Group     BP (!) 157/60     Girls Systolic BP Percentile      Girls Diastolic BP Percentile      Boys Systolic BP Percentile      Boys Diastolic BP Percentile      Pulse Rate 72     Resp 18     Temp 98.6 F (37 C)     Temp Source Oral     SpO2 96 %     Weight 90 kg (198 lb 6.6 oz)     Height 1.651 m (5' 5)     Head Circumference      Peak Flow      Pain Score 0     Pain Loc      Pain Education      Exclude from Growth Chart     Most recent vital signs: Vitals:   12/03/23 0339 12/03/23 0345  BP:    Pulse: 67 63  Resp: 16 20  Temp:    SpO2: 100% 100%    General: Awake, no distress.  Generally well-appearing. CV:  Good peripheral perfusion.   Regular rate and rhythm.  Normal heart sounds. Resp:  Normal effort. Speaking easily and comfortably, no accessory muscle usage nor intercostal retractions.  Lungs are clear to auscultation bilaterally. Abd:  No distention.    ED Results / Procedures / Treatments   Labs (all labs ordered are listed, but only abnormal results are displayed) Labs Reviewed  BASIC METABOLIC PANEL WITH GFR - Abnormal; Notable for the following components:      Result Value   Glucose, Bld 155 (*)    BUN 27 (*)    All other components within normal limits  PROTIME-INR - Abnormal; Notable for the following components:   Prothrombin Time 18.8 (*)    INR 1.5 (*)    All other components within normal limits  CBC  TROPONIN I (HIGH SENSITIVITY)  TROPONIN I (HIGH SENSITIVITY)     EKG  ED ECG REPORT I, Darleene Dome, the attending physician, personally viewed and interpreted this ECG.  Date: 12/02/2023 EKG Time: 20: 55  Rate: 71 Rhythm: normal sinus rhythm QRS Axis: normal Intervals: Incomplete right bundle branch block ST/T Wave abnormalities: Non-specific ST segment / T-wave changes, but no clear evidence of acute ischemia. Narrative Interpretation: no definitive evidence of acute ischemia; does not meet STEMI criteria.    RADIOLOGY I independently viewed and interpreted the patient's chest x-ray and I see no evidence of pneumonia or pneumothorax.  I also read the radiologist's report, which confirmed no acute findings.   PROCEDURES:  Critical Care performed: No  Procedures    IMPRESSION / MDM / ASSESSMENT AND PLAN / ED COURSE  I reviewed the triage vital signs and the nursing notes.                              Differential diagnosis includes, but is not limited to, PVCs, recurrent arrhythmias, electrolyte abnormality.  Patient's presentation is most consistent with acute presentation with potential threat to life or bodily function.  Labs/studies ordered: EKG, chest x-ray, pro  time-INR, high-sensitivity troponin, basic metabolic panel, CBC  Interventions/Medications given:  Medications - No data to display  (Note:  hospital course my include additional interventions and/or labs/studies not listed above.)   Vital signs are normal other than some hypertension and I provided reassurance that it is not dangerous at this time and she needs to follow-up with her regular doctor to see how her new diltiazem  regimen is working.  She has been observed for an extended period of time in the emergency department and she is actually in a sinus rhythm but has not had episodes of tachycardia nor A-fib with RVR.  We discussed how she may have been feeling PVCs rather than skipped beats.  She is comfortable with the reassurance and following up with her cardiology PA as scheduled.  The patient's medical screening exam is reassuring with no indication of an emergent medical condition requiring hospitalization or additional evaluation at this point.  The patient is safe and appropriate for discharge and outpatient follow up.  The patient was on the cardiac monitor to evaluate for evidence of arrhythmia and/or significant heart rate changes.       FINAL CLINICAL IMPRESSION(S) / ED DIAGNOSES   Final diagnoses:  Palpitations     Rx / DC Orders   ED Discharge Orders     None        Note:  This document was prepared using Dragon voice recognition software and may include unintentional dictation errors.   Gordan Huxley, MD 12/03/23 [phone]

## 2023-12-22 ENCOUNTER — Ambulatory Visit: Admitting: Podiatry

## 2023-12-22 ENCOUNTER — Encounter: Payer: Self-pay | Admitting: Podiatry

## 2023-12-22 VITALS — Ht 65.0 in | Wt 198.4 lb

## 2023-12-22 DIAGNOSIS — M19072 Primary osteoarthritis, left ankle and foot: Secondary | ICD-10-CM | POA: Diagnosis not present

## 2023-12-22 DIAGNOSIS — T50905A Adverse effect of unspecified drugs, medicaments and biological substances, initial encounter: Secondary | ICD-10-CM

## 2023-12-22 DIAGNOSIS — R600 Localized edema: Secondary | ICD-10-CM | POA: Diagnosis not present

## 2023-12-22 DIAGNOSIS — M19071 Primary osteoarthritis, right ankle and foot: Secondary | ICD-10-CM

## 2023-12-22 NOTE — Progress Notes (Signed)
  Subjective:  Patient ID: Melissa Barry, female    DOB: 06-May-1945,  MRN: [id number]  Chief Complaint  Patient presents with   Foot Pain    Rm 6 Patient is here for bilateral swelling of the ankles and the dorsal aspect of the feet. Patient is concerned with strawberry colored circular spots on her legs and feet ( which began after starting new medication).    78 y.o. female presents with the above complaint. History confirmed with patient.  She is not having pain like she did she does have some discomfort on the top of the foot but mostly dealing with swelling since last visit she has undergone a colonoscopy has had development of A-fib and is on anticoagulation for this.  She has an echocardiogram scheduled today  Objective:  Physical Exam: warm, good capillary refill, no trophic changes or ulcerative lesions, normal DP and PT pulses, normal sensory exam, and varicose veins are noted she has mild edema over the dorsal midfoot and mild tenderness to palpation.  Flat macular erythematous rash on bilateral lower extremities    Radiographs: Multiple views x-ray of both feet: Prior radiographs showed mild degenerative changes in the [place] joint Assessment:   1. Peripheral edema   2. Adverse effect of drug, initial encounter      Plan:  Patient was evaluated and treated and all questions answered.  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  Recommended elevating the legs walking for exercise and wearing compression stockings.  Follow-up as needed with me for this.  She also has a new macular rash that appears to be consistent with a drug reaction.  I encouraged her to speak with her cardiologist and  PCP about this due to new medication changes. No follow-ups on file.
